# Patient Record
Sex: Female | Born: 1960
Health system: Southern US, Community
[De-identification: ages and names within clinical notes are randomized; demographics above are authoritative.]

## PROBLEM LIST (undated history)

## (undated) DIAGNOSIS — D649 Anemia, unspecified: Secondary | ICD-10-CM

## (undated) DIAGNOSIS — K219 Gastro-esophageal reflux disease without esophagitis: Secondary | ICD-10-CM

## (undated) DIAGNOSIS — E079 Disorder of thyroid, unspecified: Secondary | ICD-10-CM

## (undated) DIAGNOSIS — Z9289 Personal history of other medical treatment: Secondary | ICD-10-CM

## (undated) DIAGNOSIS — D219 Benign neoplasm of connective and other soft tissue, unspecified: Secondary | ICD-10-CM

## (undated) DIAGNOSIS — T7840XA Allergy, unspecified, initial encounter: Secondary | ICD-10-CM

## (undated) DIAGNOSIS — J302 Other seasonal allergic rhinitis: Secondary | ICD-10-CM

## (undated) HISTORY — DX: Gastro-esophageal reflux disease without esophagitis: K21.9

## (undated) HISTORY — DX: Allergy, unspecified, initial encounter: T78.40XA

## (undated) HISTORY — PX: COLONOSCOPY: SHX174

## (undated) HISTORY — PX: UPPER GI ENDOSCOPY: SHX6162

## (undated) HISTORY — PX: WISDOM TOOTH EXTRACTION: SHX21

## (undated) HISTORY — PX: DILATION AND CURETTAGE OF UTERUS: SHX78

## (undated) HISTORY — DX: Disorder of thyroid, unspecified: E07.9

---

## 2004-02-27 HISTORY — PX: BREAST SURGERY: SHX581

## 2005-05-23 ENCOUNTER — Other Ambulatory Visit: Admission: RE | Admit: 2005-05-23 | Discharge: 2005-05-23 | Payer: Self-pay | Admitting: Obstetrics and Gynecology

## 2012-08-18 ENCOUNTER — Other Ambulatory Visit: Payer: Self-pay | Admitting: Obstetrics and Gynecology

## 2012-08-18 DIAGNOSIS — R928 Other abnormal and inconclusive findings on diagnostic imaging of breast: Secondary | ICD-10-CM

## 2012-09-08 ENCOUNTER — Ambulatory Visit
Admission: RE | Admit: 2012-09-08 | Discharge: 2012-09-08 | Disposition: A | Discharge status: Home / Self Care | Payer: BC Managed Care – PPO | Source: Ambulatory Visit | Attending: Obstetrics and Gynecology | Admitting: Obstetrics and Gynecology

## 2012-09-08 DIAGNOSIS — R928 Other abnormal and inconclusive findings on diagnostic imaging of breast: Secondary | ICD-10-CM

## 2013-12-04 LAB — HM COLONOSCOPY

## 2014-07-14 IMAGING — MG MM DIGITAL DIAGNOSTIC UNILAT*R*
2 series · 2 of 2 positions shown · non-contrast
Comparison: 08/11/2012

CLINICAL DATA: Possible mass right breast identified on recent
screening mammogram.  The patient has a history of bilateral breast
reduction.

DIGITAL DIAGNOSTIC RIGHT MAMMOGRAM WITHOUT CAD AND RIGHT BREAST
ULTRASOUND:

[R CC]
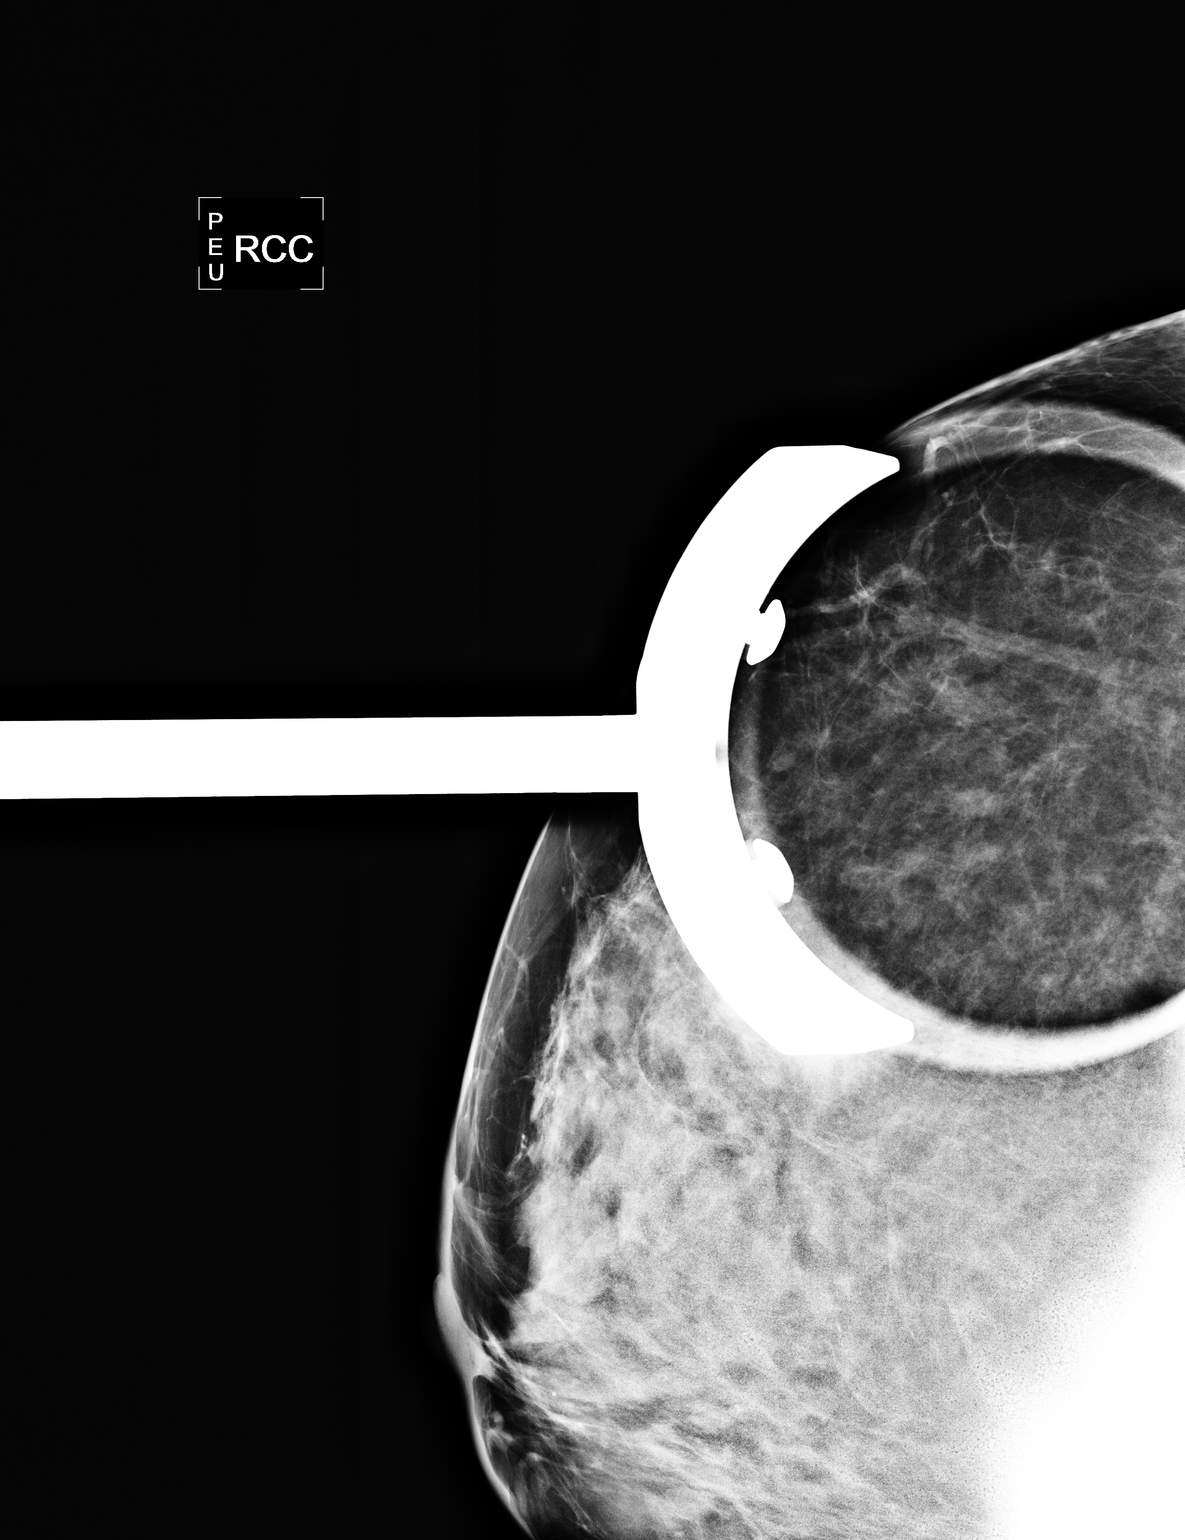

[R MLO]
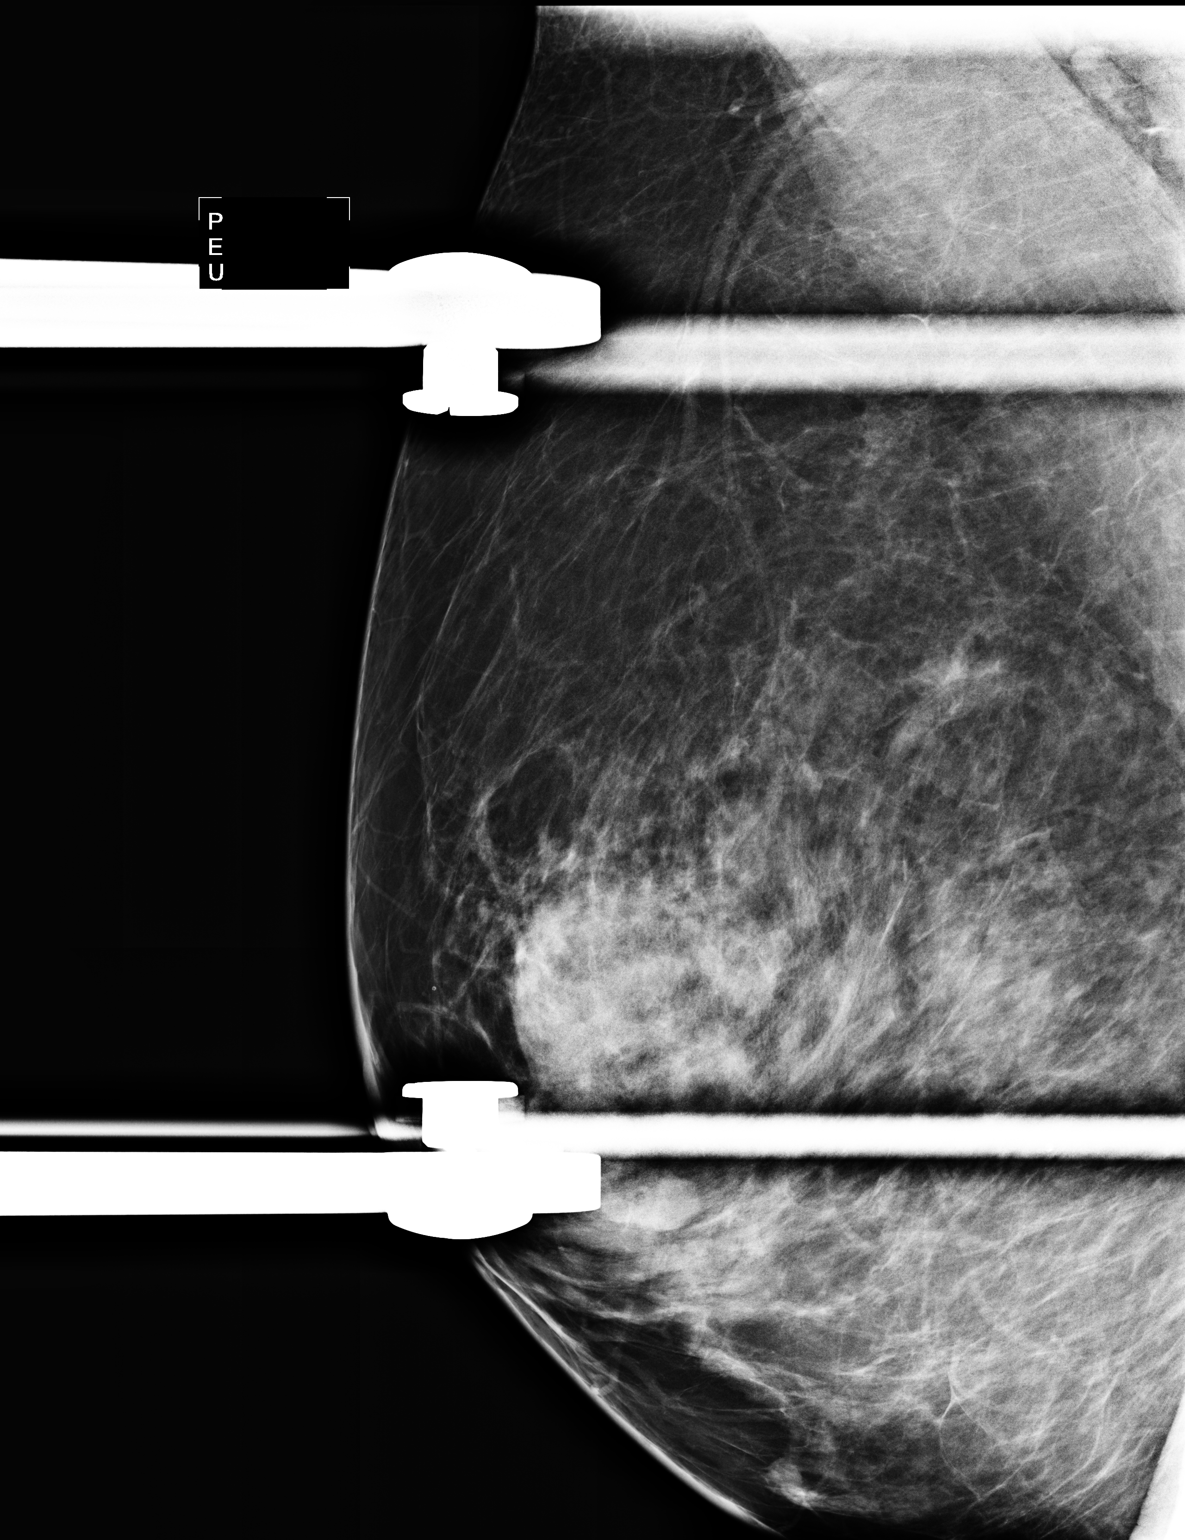

[2 of 2 positions shown; findings below may reference images not displayed]

FINDINGS: ACR Breast Density Category c:  The breasts are heterogeneously
dense, which may obscure small masses.

Focal spot compression views of the upper outer right breast show
no definite persistent mass or distortion.  Breast reduction
changes are noted.

On physical exam, no mass is palpated in the upper outer right
breast.

Ultrasound is performed, showing normal fibroglandular breast
tissue.  No solid or cystic mass is seen in the upper outer
quadrant of the right breast.
IMPRESSION: No evidence of malignancy in the right breast.

RECOMMENDATION:
Bilateral screening mammogram in 1 year.

I have discussed the findings and recommendations with the patient.
Results were also provided in writing at the conclusion of the
visit.  If applicable, a reminder letter will be sent to the
patient regarding the next appointment.

BI-RADS CATEGORY 1:  Negative.

## 2015-10-11 DIAGNOSIS — Z85828 Personal history of other malignant neoplasm of skin: Secondary | ICD-10-CM | POA: Diagnosis not present

## 2015-10-11 DIAGNOSIS — D2272 Melanocytic nevi of left lower limb, including hip: Secondary | ICD-10-CM | POA: Diagnosis not present

## 2015-10-11 DIAGNOSIS — L821 Other seborrheic keratosis: Secondary | ICD-10-CM | POA: Diagnosis not present

## 2015-10-11 DIAGNOSIS — D225 Melanocytic nevi of trunk: Secondary | ICD-10-CM | POA: Diagnosis not present

## 2015-12-20 DIAGNOSIS — Z23 Encounter for immunization: Secondary | ICD-10-CM | POA: Diagnosis not present

## 2016-01-03 DIAGNOSIS — Z0000 Encounter for general adult medical examination without abnormal findings: Secondary | ICD-10-CM | POA: Diagnosis not present

## 2016-01-03 DIAGNOSIS — Z136 Encounter for screening for cardiovascular disorders: Secondary | ICD-10-CM | POA: Diagnosis not present

## 2016-01-05 DIAGNOSIS — Z23 Encounter for immunization: Secondary | ICD-10-CM | POA: Diagnosis not present

## 2016-01-05 DIAGNOSIS — Z6829 Body mass index (BMI) 29.0-29.9, adult: Secondary | ICD-10-CM | POA: Diagnosis not present

## 2016-01-05 DIAGNOSIS — Z0000 Encounter for general adult medical examination without abnormal findings: Secondary | ICD-10-CM | POA: Diagnosis not present

## 2016-03-09 DIAGNOSIS — T1502XA Foreign body in cornea, left eye, initial encounter: Secondary | ICD-10-CM | POA: Diagnosis not present

## 2016-03-13 DIAGNOSIS — T1502XD Foreign body in cornea, left eye, subsequent encounter: Secondary | ICD-10-CM | POA: Diagnosis not present

## 2016-03-13 DIAGNOSIS — H11042 Peripheral pterygium, stationary, left eye: Secondary | ICD-10-CM | POA: Diagnosis not present

## 2016-03-28 DIAGNOSIS — Z01419 Encounter for gynecological examination (general) (routine) without abnormal findings: Secondary | ICD-10-CM | POA: Diagnosis not present

## 2016-03-28 DIAGNOSIS — Z6830 Body mass index (BMI) 30.0-30.9, adult: Secondary | ICD-10-CM | POA: Diagnosis not present

## 2016-03-28 DIAGNOSIS — Z1231 Encounter for screening mammogram for malignant neoplasm of breast: Secondary | ICD-10-CM | POA: Diagnosis not present

## 2016-03-28 DIAGNOSIS — N390 Urinary tract infection, site not specified: Secondary | ICD-10-CM | POA: Diagnosis not present

## 2016-04-12 DIAGNOSIS — J069 Acute upper respiratory infection, unspecified: Secondary | ICD-10-CM | POA: Diagnosis not present

## 2016-08-13 DIAGNOSIS — R7989 Other specified abnormal findings of blood chemistry: Secondary | ICD-10-CM | POA: Diagnosis not present

## 2016-08-13 DIAGNOSIS — Z13220 Encounter for screening for lipoid disorders: Secondary | ICD-10-CM | POA: Diagnosis not present

## 2016-08-14 DIAGNOSIS — R635 Abnormal weight gain: Secondary | ICD-10-CM | POA: Diagnosis not present

## 2016-08-14 DIAGNOSIS — E782 Mixed hyperlipidemia: Secondary | ICD-10-CM | POA: Diagnosis not present

## 2016-08-14 DIAGNOSIS — Z6830 Body mass index (BMI) 30.0-30.9, adult: Secondary | ICD-10-CM | POA: Diagnosis not present

## 2016-08-14 DIAGNOSIS — Z23 Encounter for immunization: Secondary | ICD-10-CM | POA: Diagnosis not present

## 2016-08-14 DIAGNOSIS — R7989 Other specified abnormal findings of blood chemistry: Secondary | ICD-10-CM | POA: Diagnosis not present

## 2016-10-02 DIAGNOSIS — D225 Melanocytic nevi of trunk: Secondary | ICD-10-CM | POA: Diagnosis not present

## 2016-10-02 DIAGNOSIS — B070 Plantar wart: Secondary | ICD-10-CM | POA: Diagnosis not present

## 2016-10-02 DIAGNOSIS — Z85828 Personal history of other malignant neoplasm of skin: Secondary | ICD-10-CM | POA: Diagnosis not present

## 2016-10-02 DIAGNOSIS — D2272 Melanocytic nevi of left lower limb, including hip: Secondary | ICD-10-CM | POA: Diagnosis not present

## 2016-10-12 DIAGNOSIS — M9901 Segmental and somatic dysfunction of cervical region: Secondary | ICD-10-CM | POA: Diagnosis not present

## 2016-10-12 DIAGNOSIS — M791 Myalgia: Secondary | ICD-10-CM | POA: Diagnosis not present

## 2016-10-12 DIAGNOSIS — M9902 Segmental and somatic dysfunction of thoracic region: Secondary | ICD-10-CM | POA: Diagnosis not present

## 2016-10-12 DIAGNOSIS — M7542 Impingement syndrome of left shoulder: Secondary | ICD-10-CM | POA: Diagnosis not present

## 2016-10-17 DIAGNOSIS — M7542 Impingement syndrome of left shoulder: Secondary | ICD-10-CM | POA: Diagnosis not present

## 2016-10-17 DIAGNOSIS — M9901 Segmental and somatic dysfunction of cervical region: Secondary | ICD-10-CM | POA: Diagnosis not present

## 2016-10-17 DIAGNOSIS — M791 Myalgia: Secondary | ICD-10-CM | POA: Diagnosis not present

## 2016-10-17 DIAGNOSIS — M9902 Segmental and somatic dysfunction of thoracic region: Secondary | ICD-10-CM | POA: Diagnosis not present

## 2016-11-06 DIAGNOSIS — M9901 Segmental and somatic dysfunction of cervical region: Secondary | ICD-10-CM | POA: Diagnosis not present

## 2016-11-06 DIAGNOSIS — M7542 Impingement syndrome of left shoulder: Secondary | ICD-10-CM | POA: Diagnosis not present

## 2016-11-06 DIAGNOSIS — M791 Myalgia: Secondary | ICD-10-CM | POA: Diagnosis not present

## 2016-11-06 DIAGNOSIS — M9902 Segmental and somatic dysfunction of thoracic region: Secondary | ICD-10-CM | POA: Diagnosis not present

## 2016-11-15 DIAGNOSIS — M9902 Segmental and somatic dysfunction of thoracic region: Secondary | ICD-10-CM | POA: Diagnosis not present

## 2016-11-15 DIAGNOSIS — M7712 Lateral epicondylitis, left elbow: Secondary | ICD-10-CM | POA: Diagnosis not present

## 2016-11-15 DIAGNOSIS — M9901 Segmental and somatic dysfunction of cervical region: Secondary | ICD-10-CM | POA: Diagnosis not present

## 2016-11-15 DIAGNOSIS — M7542 Impingement syndrome of left shoulder: Secondary | ICD-10-CM | POA: Diagnosis not present

## 2016-11-22 DIAGNOSIS — M9902 Segmental and somatic dysfunction of thoracic region: Secondary | ICD-10-CM | POA: Diagnosis not present

## 2016-11-22 DIAGNOSIS — M9901 Segmental and somatic dysfunction of cervical region: Secondary | ICD-10-CM | POA: Diagnosis not present

## 2016-11-22 DIAGNOSIS — M7542 Impingement syndrome of left shoulder: Secondary | ICD-10-CM | POA: Diagnosis not present

## 2016-11-22 DIAGNOSIS — M7712 Lateral epicondylitis, left elbow: Secondary | ICD-10-CM | POA: Diagnosis not present

## 2016-12-04 DIAGNOSIS — M7712 Lateral epicondylitis, left elbow: Secondary | ICD-10-CM | POA: Diagnosis not present

## 2016-12-04 DIAGNOSIS — M9901 Segmental and somatic dysfunction of cervical region: Secondary | ICD-10-CM | POA: Diagnosis not present

## 2016-12-04 DIAGNOSIS — M9902 Segmental and somatic dysfunction of thoracic region: Secondary | ICD-10-CM | POA: Diagnosis not present

## 2016-12-04 DIAGNOSIS — M7542 Impingement syndrome of left shoulder: Secondary | ICD-10-CM | POA: Diagnosis not present

## 2017-01-09 DIAGNOSIS — M9902 Segmental and somatic dysfunction of thoracic region: Secondary | ICD-10-CM | POA: Diagnosis not present

## 2017-01-09 DIAGNOSIS — M7542 Impingement syndrome of left shoulder: Secondary | ICD-10-CM | POA: Diagnosis not present

## 2017-01-09 DIAGNOSIS — S76911A Strain of unspecified muscles, fascia and tendons at thigh level, right thigh, initial encounter: Secondary | ICD-10-CM | POA: Diagnosis not present

## 2017-01-09 DIAGNOSIS — M9901 Segmental and somatic dysfunction of cervical region: Secondary | ICD-10-CM | POA: Diagnosis not present

## 2017-01-11 DIAGNOSIS — M7542 Impingement syndrome of left shoulder: Secondary | ICD-10-CM | POA: Diagnosis not present

## 2017-01-11 DIAGNOSIS — Z0000 Encounter for general adult medical examination without abnormal findings: Secondary | ICD-10-CM | POA: Diagnosis not present

## 2017-01-11 DIAGNOSIS — Z13220 Encounter for screening for lipoid disorders: Secondary | ICD-10-CM | POA: Diagnosis not present

## 2017-01-11 DIAGNOSIS — Z114 Encounter for screening for human immunodeficiency virus [HIV]: Secondary | ICD-10-CM | POA: Diagnosis not present

## 2017-01-11 DIAGNOSIS — Z1329 Encounter for screening for other suspected endocrine disorder: Secondary | ICD-10-CM | POA: Diagnosis not present

## 2017-01-11 DIAGNOSIS — M9901 Segmental and somatic dysfunction of cervical region: Secondary | ICD-10-CM | POA: Diagnosis not present

## 2017-01-11 DIAGNOSIS — M9902 Segmental and somatic dysfunction of thoracic region: Secondary | ICD-10-CM | POA: Diagnosis not present

## 2017-01-11 DIAGNOSIS — S76911A Strain of unspecified muscles, fascia and tendons at thigh level, right thigh, initial encounter: Secondary | ICD-10-CM | POA: Diagnosis not present

## 2017-01-14 DIAGNOSIS — M7918 Myalgia, other site: Secondary | ICD-10-CM | POA: Diagnosis not present

## 2017-01-14 DIAGNOSIS — S76911A Strain of unspecified muscles, fascia and tendons at thigh level, right thigh, initial encounter: Secondary | ICD-10-CM | POA: Diagnosis not present

## 2017-01-14 DIAGNOSIS — M79604 Pain in right leg: Secondary | ICD-10-CM | POA: Diagnosis not present

## 2017-01-16 DIAGNOSIS — Z0000 Encounter for general adult medical examination without abnormal findings: Secondary | ICD-10-CM | POA: Diagnosis not present

## 2017-01-16 DIAGNOSIS — Z23 Encounter for immunization: Secondary | ICD-10-CM | POA: Diagnosis not present

## 2017-01-16 DIAGNOSIS — M79604 Pain in right leg: Secondary | ICD-10-CM | POA: Diagnosis not present

## 2017-01-16 DIAGNOSIS — S76911A Strain of unspecified muscles, fascia and tendons at thigh level, right thigh, initial encounter: Secondary | ICD-10-CM | POA: Diagnosis not present

## 2017-01-16 DIAGNOSIS — Z6829 Body mass index (BMI) 29.0-29.9, adult: Secondary | ICD-10-CM | POA: Diagnosis not present

## 2017-01-16 DIAGNOSIS — M7918 Myalgia, other site: Secondary | ICD-10-CM | POA: Diagnosis not present

## 2017-01-25 DIAGNOSIS — M79604 Pain in right leg: Secondary | ICD-10-CM | POA: Diagnosis not present

## 2017-01-25 DIAGNOSIS — M7918 Myalgia, other site: Secondary | ICD-10-CM | POA: Diagnosis not present

## 2017-01-25 DIAGNOSIS — S76911A Strain of unspecified muscles, fascia and tendons at thigh level, right thigh, initial encounter: Secondary | ICD-10-CM | POA: Diagnosis not present

## 2017-02-01 DIAGNOSIS — M7918 Myalgia, other site: Secondary | ICD-10-CM | POA: Diagnosis not present

## 2017-02-01 DIAGNOSIS — S76911A Strain of unspecified muscles, fascia and tendons at thigh level, right thigh, initial encounter: Secondary | ICD-10-CM | POA: Diagnosis not present

## 2017-02-01 DIAGNOSIS — M79604 Pain in right leg: Secondary | ICD-10-CM | POA: Diagnosis not present

## 2017-02-06 DIAGNOSIS — M79604 Pain in right leg: Secondary | ICD-10-CM | POA: Diagnosis not present

## 2017-02-06 DIAGNOSIS — M7918 Myalgia, other site: Secondary | ICD-10-CM | POA: Diagnosis not present

## 2017-02-06 DIAGNOSIS — S76911A Strain of unspecified muscles, fascia and tendons at thigh level, right thigh, initial encounter: Secondary | ICD-10-CM | POA: Diagnosis not present

## 2017-05-30 DIAGNOSIS — D259 Leiomyoma of uterus, unspecified: Secondary | ICD-10-CM | POA: Diagnosis not present

## 2017-06-25 DIAGNOSIS — Z6830 Body mass index (BMI) 30.0-30.9, adult: Secondary | ICD-10-CM | POA: Diagnosis not present

## 2017-06-25 DIAGNOSIS — Z1231 Encounter for screening mammogram for malignant neoplasm of breast: Secondary | ICD-10-CM | POA: Diagnosis not present

## 2017-06-25 DIAGNOSIS — Z01419 Encounter for gynecological examination (general) (routine) without abnormal findings: Secondary | ICD-10-CM | POA: Diagnosis not present

## 2017-06-28 DIAGNOSIS — Z803 Family history of malignant neoplasm of breast: Secondary | ICD-10-CM | POA: Diagnosis not present

## 2017-06-28 DIAGNOSIS — Z808 Family history of malignant neoplasm of other organs or systems: Secondary | ICD-10-CM | POA: Diagnosis not present

## 2017-06-28 DIAGNOSIS — Z13820 Encounter for screening for osteoporosis: Secondary | ICD-10-CM | POA: Diagnosis not present

## 2017-06-28 DIAGNOSIS — Z800 Family history of malignant neoplasm of digestive organs: Secondary | ICD-10-CM | POA: Diagnosis not present

## 2017-06-28 DIAGNOSIS — Z8042 Family history of malignant neoplasm of prostate: Secondary | ICD-10-CM | POA: Diagnosis not present

## 2017-08-07 DIAGNOSIS — Z809 Family history of malignant neoplasm, unspecified: Secondary | ICD-10-CM | POA: Diagnosis not present

## 2017-08-08 DIAGNOSIS — L814 Other melanin hyperpigmentation: Secondary | ICD-10-CM | POA: Diagnosis not present

## 2017-08-08 DIAGNOSIS — L821 Other seborrheic keratosis: Secondary | ICD-10-CM | POA: Diagnosis not present

## 2017-08-08 DIAGNOSIS — D1801 Hemangioma of skin and subcutaneous tissue: Secondary | ICD-10-CM | POA: Diagnosis not present

## 2017-08-08 DIAGNOSIS — Z85828 Personal history of other malignant neoplasm of skin: Secondary | ICD-10-CM | POA: Diagnosis not present

## 2017-12-03 DIAGNOSIS — D2261 Melanocytic nevi of right upper limb, including shoulder: Secondary | ICD-10-CM | POA: Diagnosis not present

## 2017-12-03 DIAGNOSIS — Z85828 Personal history of other malignant neoplasm of skin: Secondary | ICD-10-CM | POA: Diagnosis not present

## 2017-12-03 DIAGNOSIS — L814 Other melanin hyperpigmentation: Secondary | ICD-10-CM | POA: Diagnosis not present

## 2017-12-03 DIAGNOSIS — D1801 Hemangioma of skin and subcutaneous tissue: Secondary | ICD-10-CM | POA: Diagnosis not present

## 2017-12-03 DIAGNOSIS — B078 Other viral warts: Secondary | ICD-10-CM | POA: Diagnosis not present

## 2017-12-30 DIAGNOSIS — D259 Leiomyoma of uterus, unspecified: Secondary | ICD-10-CM | POA: Diagnosis not present

## 2017-12-30 DIAGNOSIS — R102 Pelvic and perineal pain: Secondary | ICD-10-CM | POA: Diagnosis not present

## 2018-01-29 DIAGNOSIS — Z114 Encounter for screening for human immunodeficiency virus [HIV]: Secondary | ICD-10-CM | POA: Diagnosis not present

## 2018-01-29 DIAGNOSIS — Z13220 Encounter for screening for lipoid disorders: Secondary | ICD-10-CM | POA: Diagnosis not present

## 2018-01-29 DIAGNOSIS — Z1329 Encounter for screening for other suspected endocrine disorder: Secondary | ICD-10-CM | POA: Diagnosis not present

## 2018-01-29 DIAGNOSIS — Z0000 Encounter for general adult medical examination without abnormal findings: Secondary | ICD-10-CM | POA: Diagnosis not present

## 2018-01-29 LAB — BASIC METABOLIC PANEL
BUN: 24 — AB (ref 4–21)
Creatinine: 0.9 (ref 0.5–1.1)
Glucose: 112

## 2018-01-29 LAB — COMPREHENSIVE METABOLIC PANEL: GFR calc non Af Amer: 70

## 2018-01-29 LAB — HM HIV SCREENING LAB: HM HIV Screening: NEGATIVE

## 2018-01-29 LAB — TSH: TSH: 0.57 (ref 0.41–5.90)

## 2018-02-03 DIAGNOSIS — Z23 Encounter for immunization: Secondary | ICD-10-CM | POA: Diagnosis not present

## 2018-02-03 DIAGNOSIS — Z6830 Body mass index (BMI) 30.0-30.9, adult: Secondary | ICD-10-CM | POA: Diagnosis not present

## 2018-02-03 DIAGNOSIS — Z0000 Encounter for general adult medical examination without abnormal findings: Secondary | ICD-10-CM | POA: Diagnosis not present

## 2018-02-12 ENCOUNTER — Encounter (HOSPITAL_COMMUNITY): Payer: Self-pay | Admitting: *Deleted

## 2018-02-12 ENCOUNTER — Other Ambulatory Visit: Payer: Self-pay

## 2018-02-12 ENCOUNTER — Inpatient Hospital Stay (HOSPITAL_COMMUNITY): Admission: RE | Admit: 2018-02-12 | Payer: BC Managed Care – PPO | Source: Ambulatory Visit

## 2018-02-12 ENCOUNTER — Encounter (HOSPITAL_COMMUNITY)
Admission: RE | Admit: 2018-02-12 | Discharge: 2018-02-12 | Disposition: A | Discharge status: Home / Self Care | Payer: BLUE CROSS/BLUE SHIELD | Source: Ambulatory Visit | Attending: Obstetrics and Gynecology | Admitting: Obstetrics and Gynecology

## 2018-02-12 DIAGNOSIS — Z01812 Encounter for preprocedural laboratory examination: Secondary | ICD-10-CM | POA: Diagnosis not present

## 2018-02-12 HISTORY — DX: Other seasonal allergic rhinitis: J30.2

## 2018-02-12 HISTORY — DX: Anemia, unspecified: D64.9

## 2018-02-12 HISTORY — DX: Personal history of other medical treatment: Z92.89

## 2018-02-12 HISTORY — DX: Benign neoplasm of connective and other soft tissue, unspecified: D21.9

## 2018-02-12 LAB — CBC
HCT: 41.9 % (ref 36.0–46.0)
Hemoglobin: 14.3 g/dL (ref 12.0–15.0)
MCH: 29.9 pg (ref 26.0–34.0)
MCHC: 34.1 g/dL (ref 30.0–36.0)
MCV: 87.5 fL (ref 80.0–100.0)
Platelets: 255 10*3/uL (ref 150–400)
RBC: 4.79 MIL/uL (ref 3.87–5.11)
RDW: 12.6 % (ref 11.5–15.5)
WBC: 6 10*3/uL (ref 4.0–10.5)
nRBC: 0 % (ref 0.0–0.2)

## 2018-02-12 NOTE — Patient Instructions (Addendum)
Your procedure is scheduled on: Thursday, 02/27/18  Enter through the Main Entrance of [facility] at: 6 am  Pick up the phone at the desk and dial [phone].  Call this number if you have problems the morning of surgery: [phone].  Remember: Do NOT eat food or Do NOT drink clear liquids (including water) after Wednesday 02/26/18  Take these medicines the morning of surgery with a SIP OF WATER: Claritin if needed  Brush your teeth on the day of surgery.  Stop herbal medications, vitamin supplements, Ibuprofen/NSAIDS 1 week prior to surgery- Thursday, 02/20/18.  Do NOT wear jewelry (body piercing), metal hair clips/bobby pins, make-up, or nail polish. Do NOT wear lotions, powders, or perfumes.  You may wear deoderant. Do NOT shave for 48 hours prior to surgery. Do NOT bring valuables to the hospital. Contacts may not be worn into surgery.  Leave suitcase in car.  After surgery it may be brought to your room.  For patients admitted to the hospital, checkout time is 11:00 AM the day of discharge. Have a responsible adult drive you home and stay with you for 24 hours after your procedure.  Home with husband Elizabeth Carpenter cell [phone]

## 2018-02-25 ENCOUNTER — Inpatient Hospital Stay (HOSPITAL_COMMUNITY): Admission: RE | Admit: 2018-02-25 | Payer: Self-pay | Source: Ambulatory Visit

## 2018-02-25 DIAGNOSIS — D259 Leiomyoma of uterus, unspecified: Secondary | ICD-10-CM | POA: Diagnosis not present

## 2018-02-25 DIAGNOSIS — R102 Pelvic and perineal pain: Secondary | ICD-10-CM | POA: Diagnosis not present

## 2018-02-26 NOTE — H&P (Addendum)
Elizabeth Carpenter is an 58 y.o. female presents for hysterectomy.   She has been having problems with pelvic fullness, pressure and pain from fibroids which are 12 to 14 weeks size.  Reviewed the previous pelvic ultrasound done on 05/30/2017 with the largest fibroids measuring 4.6 cm in size.  She has at least 4 to 5 moderate-sized fibroids.  She is continuing to have pelvic pressure, pain and increased fullness and would like to proceed with hysterectomy.  Pertinent Gynecological History: U/S 05/30/17 does show multiple fibroids, the largest measuring 4.6 x 3.9 cm, but at least 4 or 5 moderate-sized fibroids with overall uterus approximately 14 weeks size.   No LMP recorded (lmp unknown). Patient is postmenopausal.    Past Medical History:  Diagnosis Date  . Anemia   . Fibroids   . History of blood transfusion    Patient had a transfusion at birth [phone]  . Seasonal allergies   . SVD (spontaneous vaginal delivery)    x 2    Past Surgical History:  Procedure Laterality Date  . BREAST SURGERY  2006   reduction  . COLONOSCOPY    . DILATION AND CURETTAGE OF UTERUS    . UPPER GI ENDOSCOPY    . WISDOM TOOTH EXTRACTION      No family history on file.  Social History:  reports that she has never smoked. She has never used smokeless tobacco. She reports current alcohol use of about 3.0 - 5.0 standard drinks of alcohol per week. She reports that she does not use drugs.  Allergies:  Allergies  Allergen Reactions  . Morphine And Related Nausea And Vomiting  . Amoxicillin Rash    No medications prior to admission.    ROS  There were no vitals taken for this visit. Physical Exam  General:  Alert and oriented.  Normocephalic and atraumatic.  No thyromegaly or masses palpated.  Heart is regular rate and rhythm without murmur.  Lungs are clear to auscultation bilaterally.  Breasts without masses, lymphadenopathy or discharge.  Abdomen is soft, nontender, nondistended.  No rebound or  guarding.  No flank pain is noted.  Extremities without clubbing, cyanosis or edema.  Normal external genitalia, Bartholin, Skene's and urethra.  Vagina without lesions.  Cervix within normal limits.  Uterus is 12 weeks size.  No adnexal masses are palpable.  No inguinal lymphadenopathy palpable.  No results found for this or any previous visit (from the past 24 hour(s)).  No results found.  Assessment/Plan: Pelvic fullness, fibroids 14 weeks size.  Plan laparoscopically-assisted vaginal hysterectomy with bilateral salpingo-oophorectomy.  Discussed the possibility of converting to a TAH/BSO.  She is postmenopausal and does want to proceed with this plan of care. I discussed the plan of care, the pros and cons, risks and benefits and we discussed the recovery at length.  All her questions were answered and she gives informed consent.  Luz Lex 02/26/2018, 12:38 PM   This patient has been seen and examined.   All of her questions were answered.  Labs and vital signs reviewed.  Informed consent has been obtained.  The History and Physical is current. DL

## 2018-02-27 ENCOUNTER — Other Ambulatory Visit: Payer: Self-pay

## 2018-02-27 ENCOUNTER — Encounter (HOSPITAL_COMMUNITY)
Admission: RE | Disposition: A | Discharge status: Home / Self Care | Payer: Self-pay | Source: Ambulatory Visit | Attending: Obstetrics and Gynecology

## 2018-02-27 ENCOUNTER — Observation Stay (HOSPITAL_COMMUNITY)
Admission: RE | Admit: 2018-02-27 | Discharge: 2018-02-28 | Disposition: A | Discharge status: Home / Self Care | Payer: BLUE CROSS/BLUE SHIELD | Source: Ambulatory Visit | Attending: Obstetrics and Gynecology | Admitting: Obstetrics and Gynecology

## 2018-02-27 ENCOUNTER — Observation Stay (HOSPITAL_COMMUNITY): Payer: BLUE CROSS/BLUE SHIELD | Admitting: Anesthesiology

## 2018-02-27 ENCOUNTER — Encounter (HOSPITAL_COMMUNITY): Payer: Self-pay | Admitting: *Deleted

## 2018-02-27 DIAGNOSIS — D270 Benign neoplasm of right ovary: Secondary | ICD-10-CM | POA: Insufficient documentation

## 2018-02-27 DIAGNOSIS — D259 Leiomyoma of uterus, unspecified: Secondary | ICD-10-CM | POA: Diagnosis not present

## 2018-02-27 DIAGNOSIS — N802 Endometriosis of fallopian tube: Secondary | ICD-10-CM | POA: Diagnosis not present

## 2018-02-27 DIAGNOSIS — D252 Subserosal leiomyoma of uterus: Secondary | ICD-10-CM | POA: Insufficient documentation

## 2018-02-27 DIAGNOSIS — N803 Endometriosis of pelvic peritoneum: Secondary | ICD-10-CM | POA: Diagnosis not present

## 2018-02-27 DIAGNOSIS — D250 Submucous leiomyoma of uterus: Secondary | ICD-10-CM | POA: Insufficient documentation

## 2018-02-27 DIAGNOSIS — Z885 Allergy status to narcotic agent status: Secondary | ICD-10-CM | POA: Diagnosis not present

## 2018-02-27 DIAGNOSIS — E669 Obesity, unspecified: Secondary | ICD-10-CM | POA: Diagnosis not present

## 2018-02-27 DIAGNOSIS — N736 Female pelvic peritoneal adhesions (postinfective): Secondary | ICD-10-CM | POA: Insufficient documentation

## 2018-02-27 DIAGNOSIS — D251 Intramural leiomyoma of uterus: Principal | ICD-10-CM | POA: Insufficient documentation

## 2018-02-27 DIAGNOSIS — D649 Anemia, unspecified: Secondary | ICD-10-CM | POA: Insufficient documentation

## 2018-02-27 DIAGNOSIS — Z6830 Body mass index (BMI) 30.0-30.9, adult: Secondary | ICD-10-CM | POA: Diagnosis not present

## 2018-02-27 DIAGNOSIS — R102 Pelvic and perineal pain: Secondary | ICD-10-CM | POA: Insufficient documentation

## 2018-02-27 DIAGNOSIS — N9489 Other specified conditions associated with female genital organs and menstrual cycle: Secondary | ICD-10-CM | POA: Insufficient documentation

## 2018-02-27 DIAGNOSIS — Z880 Allergy status to penicillin: Secondary | ICD-10-CM | POA: Insufficient documentation

## 2018-02-27 DIAGNOSIS — Z791 Long term (current) use of non-steroidal anti-inflammatories (NSAID): Secondary | ICD-10-CM | POA: Insufficient documentation

## 2018-02-27 DIAGNOSIS — N801 Endometriosis of ovary: Secondary | ICD-10-CM | POA: Diagnosis not present

## 2018-02-27 DIAGNOSIS — Z90710 Acquired absence of both cervix and uterus: Secondary | ICD-10-CM | POA: Diagnosis present

## 2018-02-27 HISTORY — PX: LAPAROSCOPIC VAGINAL HYSTERECTOMY WITH SALPINGO OOPHORECTOMY: SHX6681

## 2018-02-27 HISTORY — PX: HYSTERECTOMY ABDOMINAL WITH SALPINGO-OOPHORECTOMY: SHX6792

## 2018-02-27 LAB — TYPE AND SCREEN
ABO/RH(D): B POS
Antibody Screen: NEGATIVE

## 2018-02-27 LAB — ABO/RH: ABO/RH(D): B POS

## 2018-02-27 SURGERY — HYSTERECTOMY, VAGINAL, LAPAROSCOPY-ASSISTED, WITH SALPINGO-OOPHORECTOMY
Anesthesia: General | Site: Abdomen | Laterality: Bilateral

## 2018-02-27 MED ORDER — DEXAMETHASONE SODIUM PHOSPHATE 4 MG/ML IJ SOLN
INTRAMUSCULAR | Status: AC
  Filled 2018-02-27: qty 1

## 2018-02-27 MED ORDER — SCOPOLAMINE 1 MG/3DAYS TD PT72
MEDICATED_PATCH | TRANSDERMAL | Status: AC
  Administered 2018-02-27: 1.5 mg via TRANSDERMAL
  Filled 2018-02-27: qty 1

## 2018-02-27 MED ORDER — OXYCODONE HCL 5 MG/5ML PO SOLN: 5.0000 mg | Freq: Once | ORAL | Status: DC | PRN

## 2018-02-27 MED ORDER — ROCURONIUM BROMIDE 100 MG/10ML IV SOLN
INTRAVENOUS | Status: DC | PRN
  Administered 2018-02-27: 50 mg via INTRAVENOUS

## 2018-02-27 MED ORDER — ACETAMINOPHEN 325 MG PO TABS
650.0000 mg | ORAL_TABLET | Freq: Four times a day (QID) | ORAL | Status: DC | PRN
  Administered 2018-02-28 (×2): 650 mg via ORAL
  Filled 2018-02-27 (×2): qty 2

## 2018-02-27 MED ORDER — FENTANYL CITRATE (PF) 100 MCG/2ML IJ SOLN
INTRAMUSCULAR | Status: AC
  Filled 2018-02-27: qty 2

## 2018-02-27 MED ORDER — DIPHENHYDRAMINE HCL 50 MG/ML IJ SOLN: 12.5000 mg | Freq: Four times a day (QID) | INTRAMUSCULAR | Status: DC | PRN

## 2018-02-27 MED ORDER — DEXTROSE-NACL 5-0.45 % IV SOLN
INTRAVENOUS | Status: DC
  Administered 2018-02-27 (×2): via INTRAVENOUS

## 2018-02-27 MED ORDER — IBUPROFEN 600 MG PO TABS
600.0000 mg | ORAL_TABLET | Freq: Four times a day (QID) | ORAL | Status: DC | PRN
  Administered 2018-02-27 – 2018-02-28 (×2): 600 mg via ORAL
  Filled 2018-02-27 (×2): qty 1

## 2018-02-27 MED ORDER — MENTHOL 3 MG MT LOZG: 1.0000 | LOZENGE | OROMUCOSAL | Status: DC | PRN

## 2018-02-27 MED ORDER — PROMETHAZINE HCL 25 MG/ML IJ SOLN: 6.2500 mg | INTRAMUSCULAR | Status: DC | PRN

## 2018-02-27 MED ORDER — SODIUM CHLORIDE 0.9 % IR SOLN
Status: DC | PRN
  Administered 2018-02-27: 3000 mL

## 2018-02-27 MED ORDER — OXYCODONE HCL 5 MG PO TABS: 5.0000 mg | ORAL_TABLET | Freq: Four times a day (QID) | ORAL | Status: DC | PRN

## 2018-02-27 MED ORDER — SUGAMMADEX SODIUM 200 MG/2ML IV SOLN
INTRAVENOUS | Status: DC | PRN
  Administered 2018-02-27: 200 mg via INTRAVENOUS

## 2018-02-27 MED ORDER — LACTATED RINGERS IV SOLN
INTRAVENOUS | Status: DC
  Administered 2018-02-27 (×2): via INTRAVENOUS

## 2018-02-27 MED ORDER — BUPIVACAINE HCL (PF) 0.25 % IJ SOLN
INTRAMUSCULAR | Status: DC | PRN
  Administered 2018-02-27: 10 mL

## 2018-02-27 MED ORDER — HYDROMORPHONE HCL 1 MG/ML IJ SOLN
INTRAMUSCULAR | Status: AC
  Filled 2018-02-27: qty 1

## 2018-02-27 MED ORDER — OXYCODONE HCL 5 MG PO TABS: 5.0000 mg | ORAL_TABLET | Freq: Once | ORAL | Status: DC | PRN

## 2018-02-27 MED ORDER — SCOPOLAMINE 1 MG/3DAYS TD PT72
1.0000 | MEDICATED_PATCH | Freq: Once | TRANSDERMAL | Status: DC
  Administered 2018-02-27: 1.5 mg via TRANSDERMAL

## 2018-02-27 MED ORDER — DEXAMETHASONE SODIUM PHOSPHATE 10 MG/ML IJ SOLN
INTRAMUSCULAR | Status: DC | PRN
  Administered 2018-02-27: 4 mg via INTRAVENOUS

## 2018-02-27 MED ORDER — ROCURONIUM BROMIDE 100 MG/10ML IV SOLN
INTRAVENOUS | Status: AC
  Filled 2018-02-27: qty 1

## 2018-02-27 MED ORDER — ALUM & MAG HYDROXIDE-SIMETH 200-200-20 MG/5ML PO SUSP: 30.0000 mL | ORAL | Status: DC | PRN

## 2018-02-27 MED ORDER — ONDANSETRON HCL 4 MG/2ML IJ SOLN: 4.0000 mg | Freq: Four times a day (QID) | INTRAMUSCULAR | Status: DC | PRN

## 2018-02-27 MED ORDER — ZOLPIDEM TARTRATE 5 MG PO TABS: 5.0000 mg | ORAL_TABLET | Freq: Every evening | ORAL | Status: DC | PRN

## 2018-02-27 MED ORDER — DIPHENHYDRAMINE HCL 12.5 MG/5ML PO ELIX: 12.5000 mg | ORAL_SOLUTION | Freq: Four times a day (QID) | ORAL | Status: DC | PRN

## 2018-02-27 MED ORDER — FENTANYL CITRATE (PF) 250 MCG/5ML IJ SOLN
INTRAMUSCULAR | Status: AC
  Filled 2018-02-27: qty 5

## 2018-02-27 MED ORDER — ONDANSETRON HCL 4 MG/2ML IJ SOLN
INTRAMUSCULAR | Status: AC
  Filled 2018-02-27: qty 2

## 2018-02-27 MED ORDER — SODIUM CHLORIDE 0.9% FLUSH: 9.0000 mL | INTRAVENOUS | Status: DC | PRN

## 2018-02-27 MED ORDER — BUPIVACAINE HCL (PF) 0.25 % IJ SOLN
INTRAMUSCULAR | Status: AC
  Filled 2018-02-27: qty 30

## 2018-02-27 MED ORDER — MIDAZOLAM HCL 2 MG/2ML IJ SOLN
INTRAMUSCULAR | Status: AC
  Filled 2018-02-27: qty 2

## 2018-02-27 MED ORDER — HYDROMORPHONE HCL 1 MG/ML IJ SOLN
INTRAMUSCULAR | Status: DC | PRN
  Administered 2018-02-27 (×2): 0.5 mg via INTRAVENOUS

## 2018-02-27 MED ORDER — LIDOCAINE HCL (CARDIAC) PF 100 MG/5ML IV SOSY
PREFILLED_SYRINGE | INTRAVENOUS | Status: DC | PRN
  Administered 2018-02-27: 80 mg via INTRAVENOUS

## 2018-02-27 MED ORDER — MIDAZOLAM HCL 2 MG/2ML IJ SOLN
INTRAMUSCULAR | Status: DC | PRN
  Administered 2018-02-27: 2 mg via INTRAVENOUS

## 2018-02-27 MED ORDER — LIDOCAINE HCL (CARDIAC) PF 100 MG/5ML IV SOSY
PREFILLED_SYRINGE | INTRAVENOUS | Status: AC
  Filled 2018-02-27: qty 5

## 2018-02-27 MED ORDER — GENTAMICIN SULFATE 40 MG/ML IJ SOLN
INTRAVENOUS | Status: AC
  Administered 2018-02-27: 340 mg via INTRAVENOUS
  Filled 2018-02-27: qty 8.5

## 2018-02-27 MED ORDER — PROPOFOL 10 MG/ML IV BOLUS
INTRAVENOUS | Status: DC | PRN
  Administered 2018-02-27: 170 mg via INTRAVENOUS

## 2018-02-27 MED ORDER — FENTANYL CITRATE (PF) 100 MCG/2ML IJ SOLN
INTRAMUSCULAR | Status: DC | PRN
  Administered 2018-02-27 (×2): 100 ug via INTRAVENOUS
  Administered 2018-02-27: 50 ug via INTRAVENOUS

## 2018-02-27 MED ORDER — FENTANYL CITRATE (PF) 100 MCG/2ML IJ SOLN
25.0000 ug | INTRAMUSCULAR | Status: DC | PRN
  Administered 2018-02-27 (×2): 50 ug via INTRAVENOUS

## 2018-02-27 MED ORDER — NALOXONE HCL 0.4 MG/ML IJ SOLN: 0.4000 mg | INTRAMUSCULAR | Status: DC | PRN

## 2018-02-27 MED ORDER — SIMETHICONE 80 MG PO CHEW: 80.0000 mg | CHEWABLE_TABLET | Freq: Four times a day (QID) | ORAL | Status: DC | PRN

## 2018-02-27 MED ORDER — SUGAMMADEX SODIUM 200 MG/2ML IV SOLN
INTRAVENOUS | Status: AC
  Filled 2018-02-27: qty 2

## 2018-02-27 MED ORDER — ONDANSETRON HCL 4 MG/2ML IJ SOLN
INTRAMUSCULAR | Status: DC | PRN
  Administered 2018-02-27: 4 mg via INTRAVENOUS

## 2018-02-27 MED ORDER — HYDROMORPHONE 1 MG/ML IV SOLN
INTRAVENOUS | Status: DC
  Administered 2018-02-27: 30 mg via INTRAVENOUS
  Administered 2018-02-27: 2 mg via INTRAVENOUS
  Filled 2018-02-27: qty 30

## 2018-02-27 MED ORDER — PROPOFOL 10 MG/ML IV BOLUS
INTRAVENOUS | Status: AC
  Filled 2018-02-27: qty 20

## 2018-02-27 SURGICAL SUPPLY — 68 items
APL SRG 38 LTWT LNG FL B (MISCELLANEOUS) ×2
APPLICATOR ARISTA FLEXITIP XL (MISCELLANEOUS) ×2 IMPLANT
BLADE SURG 15 STRL LF C SS BP (BLADE) ×2 IMPLANT
BLADE SURG 15 STRL SS (BLADE) ×4
CABLE HIGH FREQUENCY MONO STRZ (ELECTRODE) IMPLANT
CANISTER SUCT 3000ML PPV (MISCELLANEOUS) ×4 IMPLANT
CATH ROBINSON RED A/P 16FR (CATHETERS) ×4 IMPLANT
CONT PATH 16OZ SNAP LID 3702 (MISCELLANEOUS) ×4 IMPLANT
COVER BACK TABLE 60X90IN (DRAPES) ×4 IMPLANT
COVER MAYO STAND STRL (DRAPES) ×4 IMPLANT
DECANTER SPIKE VIAL GLASS SM (MISCELLANEOUS) IMPLANT
DRAPE WARM FLUID 44X44 (DRAPE) IMPLANT
DRSG OPSITE POSTOP 3X4 (GAUZE/BANDAGES/DRESSINGS) IMPLANT
DRSG OPSITE POSTOP 4X10 (GAUZE/BANDAGES/DRESSINGS) ×4 IMPLANT
DURAPREP 26ML APPLICATOR (WOUND CARE) ×4 IMPLANT
ELECT REM PT RETURN 9FT ADLT (ELECTROSURGICAL) ×4
ELECTRODE REM PT RTRN 9FT ADLT (ELECTROSURGICAL) ×2 IMPLANT
FORCEPS CUTTING 45CM 5MM (CUTTING FORCEPS) ×4 IMPLANT
GAUZE 4X4 16PLY RFD (DISPOSABLE) IMPLANT
GLOVE BIO SURGEON STRL SZ8 (GLOVE) ×4 IMPLANT
GLOVE BIOGEL PI IND STRL 6.5 (GLOVE) ×2 IMPLANT
GLOVE BIOGEL PI IND STRL 7.0 (GLOVE) ×6 IMPLANT
GLOVE BIOGEL PI INDICATOR 6.5 (GLOVE) ×2
GLOVE BIOGEL PI INDICATOR 7.0 (GLOVE) ×6
GLOVE SURG ORTHO 8.0 STRL STRW (GLOVE) ×12 IMPLANT
GOWN STRL REUS W/TWL LRG LVL3 (GOWN DISPOSABLE) ×12 IMPLANT
HEMOSTAT ARISTA ABSORB 3G PWDR (MISCELLANEOUS) ×2 IMPLANT
HIBICLENS CHG 4% 4OZ BTL (MISCELLANEOUS) ×4 IMPLANT
LEGGING LITHOTOMY PAIR STRL (DRAPES) ×4 IMPLANT
LIGASURE IMPACT 36 18CM CVD LR (INSTRUMENTS) ×4 IMPLANT
NEEDLE INSUFFLATION 120MM (ENDOMECHANICALS) ×4 IMPLANT
NS IRRIG 1000ML POUR BTL (IV SOLUTION) ×4 IMPLANT
PACK ABDOMINAL GYN (CUSTOM PROCEDURE TRAY) ×4 IMPLANT
PACK LAVH (CUSTOM PROCEDURE TRAY) ×4 IMPLANT
PACK ROBOTIC GOWN (GOWN DISPOSABLE) ×4 IMPLANT
PACK TRENDGUARD 450 HYBRID PRO (MISCELLANEOUS) IMPLANT
PAD OB MATERNITY 4.3X12.25 (PERSONAL CARE ITEMS) ×4 IMPLANT
PROTECTOR NERVE ULNAR (MISCELLANEOUS) ×8 IMPLANT
RETRACTOR WND ALEXIS 25 LRG (MISCELLANEOUS) IMPLANT
RTRCTR WOUND ALEXIS 25CM LRG (MISCELLANEOUS)
SET IRRIG TUBING LAPAROSCOPIC (IRRIGATION / IRRIGATOR) ×2 IMPLANT
SLEEVE XCEL OPT CAN 5 100 (ENDOMECHANICALS) IMPLANT
SOLUTION ELECTROLUBE (MISCELLANEOUS) IMPLANT
SPONGE LAP 18X18 RF (DISPOSABLE) IMPLANT
SUT MNCRL 0 MO-4 VIOLET 18 CR (SUTURE) ×4 IMPLANT
SUT MNCRL 0 VIOLET 6X18 (SUTURE) ×2 IMPLANT
SUT MNCRL AB 0 CT1 27 (SUTURE) IMPLANT
SUT MNCRL AB 3-0 PS2 27 (SUTURE) ×4 IMPLANT
SUT MON AB 2-0 CT1 36 (SUTURE) IMPLANT
SUT MONOCRYL 0 6X18 (SUTURE) ×2
SUT MONOCRYL 0 MO 4 18  CR/8 (SUTURE) ×4
SUT PDS AB 0 CT1 27 (SUTURE) IMPLANT
SUT PDS AB 0 CTX 60 (SUTURE) IMPLANT
SUT PLAIN 3 0 CT 1 27 (SUTURE) ×4 IMPLANT
SUT VIC AB 0 CT1 18XCR BRD8 (SUTURE) IMPLANT
SUT VIC AB 0 CT1 8-18 (SUTURE)
SUT VIC AB 1 CTX 36 (SUTURE)
SUT VIC AB 1 CTX36XBRD ANBCTRL (SUTURE) IMPLANT
SUT VIC AB 2-0 CT1 (SUTURE) ×4 IMPLANT
SUT VICRYL 0 UR6 27IN ABS (SUTURE) ×4 IMPLANT
SUT VICRYL RAPIDE 3 0 (SUTURE) ×4 IMPLANT
TOWEL OR 17X24 6PK STRL BLUE (TOWEL DISPOSABLE) ×8 IMPLANT
TRAY FOLEY W/BAG SLVR 14FR (SET/KITS/TRAYS/PACK) ×4 IMPLANT
TRENDGUARD 450 HYBRID PRO PACK (MISCELLANEOUS) ×4
TROCAR OPTI TIP 5M 100M (ENDOMECHANICALS) ×4 IMPLANT
TROCAR XCEL DIL TIP R 11M (ENDOMECHANICALS) ×4 IMPLANT
TUBING INSUF HEATED (TUBING) ×4 IMPLANT
WARMER LAPAROSCOPE (MISCELLANEOUS) ×4 IMPLANT

## 2018-02-27 NOTE — Op Note (Signed)
Elizabeth Carpenter, Elizabeth Carpenter MEDICAL RECORD [id number] ACCOUNT [id number] DATE OF BIRTH:[birth year] FACILITY: [facility] LOCATION: [id number] Heppner, MD  OPERATIVE REPORT  DATE OF PROCEDURE:  02/27/2018  PREOPERATIVE DIAGNOSIS:  Fourteen week size fibroids, pelvic fullness and pain.  POSTOPERATIVE DIAGNOSIS:  Fourteen week size fibroids, pelvic fullness and pain.  PROCEDURE:  Laparoscopic assisted vaginal hysterectomy with bilateral salpingo-oophorectomy.  SURGEON:  Louretta Shorten, MD  ASSISTANT:  Arvella Nigh, MD  ANESTHESIA:  General endotracheal.  INDICATIONS:  The patient is a 58 year old postmenopausal white female with worsening pelvic pain and pressure.  Ultrasound shows multiple large fibroids and an overall 14-week size fibroid uterus.  She desires definitive surgical intervention, presents  for hysterectomy with bilateral salpingo-oophorectomy.  Risks and benefits were discussed at length and informed consent was obtained.  FINDINGS AT TIME OF SURGERY:  A 14-week size fibroid uterus.  Evidence of endometriosis with adhesions from the left and right ovary to the pelvic sidewall.  Unable to see the appendix.  The liver appeared to be normal.  DESCRIPTION OF PROCEDURE:  After adequate analgesia, the patient was placed in the dorsal lithotomy position.  She was sterilely prepped and draped.  Bladder was sterilely drained.  Graves speculum was placed.  A Cohen tenaculum was placed on the cervix.   A 1-cm infraumbilical skin incision was made.  A Veress needle was inserted.  The abdomen was insufflated to dullness and percussion.  The 11 mm trocar inserted.  Laparoscope was inserted.  The above findings noted.  A 5 mm trocar was inserted to the  left of the midline under direct visualization.  The Gyrus cutting forceps were used to coagulate and dissect across the right infundibulopelvic ligament down across the round ligament on the right and left side, freeing adhesions from the  pelvic  sidewall to the ovary, freeing the ovary up and as it falls towards the uterus after released from the round ligament and the IFP.  This was done bilaterally with good hemostasis and care taken to avoid the underlying ureter.  Bladder flap was identified  and incised to create a small bladder flap between the bladder and the uterus.  Abdomen was then desufflated.  Legs were repositioned.  A posterior colpotomy was performed.  Cervix was circumscribed with Bovie cautery.  The LigaSure instrument was used  to ligate across the uterosacral ligaments bilaterally, the cardinal ligaments and the bladder pillars.  The anterior vaginal mucosa was incised and reflected off the anterior surface of the cervix.  A small window in the anterior peritoneum was noted  and a Deaver retractor was placed underneath the bladder.  The LigaSure instrument was used to ligate across the uterine vasculature up to the inferior portion of the round ligament bilaterally with good hemostasis achieved.  The uterus was removed with  the fallopian tubes and ovaries intact.  Uterosacral ligaments were identified, sutured again with figure-of-eight of 0 Monocryl suture.  Posterior peritoneum was closed in a pursestring fashion with 0 Monocryl suture.  The uterosacral ligaments were  then plicated in the midline and the vagina closed in the vertical fashion using figure-of-eights of 0 Monocryl suture with good approximation and good hemostasis was noted.  Foley catheter was placed with return of clear yellow urine.  The legs were  repositioned.  Abdomen reinsufflated and ____ suction irrigator was used to irrigate the abdomen and pelvis.  Small areas of bleeding were coagulated with bipolar cautery.  After copious irrigation and adequate hemostasis, the pedicles appeared to be  hemostatic.  Ureters were noted bilaterally with good peristalsis noted.  Some Arista was placed in the posterior cul-de-sac due to some peritoneal bleeding  from the denuded area with good hemostasis noted.  The abdomen was then desufflated, trocars were  removed and the infraumbilical skin incision was closed with 0 Vicryl interrupted suture in the fascia, 3-0 Vicryl Rapide subcuticular suture.  The 5 mm site was closed with 2 interrupted sutures of 3-0 Vicryl Rapide.  Incisions were injected with 0.25%  Marcaine, a total of 10 mL used.  The patient was stable and transferred to recovery room.  Sponge and needle count was normal x3.  Estimated blood loss was 250 mL.  The patient received gentamicin and clindamycin preoperatively.  The weight of the  uterus was 276 grams.  TN/NUANCE  D:02/27/2018 T:02/27/2018 JOB:[id number]

## 2018-02-27 NOTE — Brief Op Note (Signed)
02/27/2018  9:05 AM  PATIENT:  Elizabeth Carpenter  58 y.o. female  PRE-OPERATIVE DIAGNOSIS:  14 week size fibroids, pelvic pain  POST-OPERATIVE DIAGNOSIS:  14 week size fibroids, pelvic pain  PROCEDURE:  Procedure(s): LAPAROSCOPIC ASSISTED VAGINAL HYSTERECTOMY WITH SALPINGO OOPHORECTOMY (Bilateral) possible HYSTERECTOMY ABDOMINAL WITH SALPINGO-OOPHORECTOMY (Bilateral)  SURGEON:  Surgeon(s) and Role:    Louretta Shorten, MD - Primary  PHYSICIAN ASSISTANT:   ASSISTANTS: [place]  ANESTHESIA:   general  EBL:  250 mL   BLOOD ADMINISTERED:none  DRAINS: Urinary Catheter (Foley)   LOCAL MEDICATIONS USED:  MARCAINE     SPECIMEN:  Source of Specimen:  uterus tubes and ovaries  DISPOSITION OF SPECIMEN:  PATHOLOGY  COUNTS:  YES  TOURNIQUET:  * No tourniquets in log *  DICTATION: .Other Dictation: Dictation Number 1  PLAN OF CARE: Admit for overnight observation  PATIENT DISPOSITION:  PACU - hemodynamically stable.   Delay start of Pharmacological VTE agent (>24hrs) due to surgical blood loss or risk of bleeding: not applicable

## 2018-02-27 NOTE — Anesthesia Preprocedure Evaluation (Addendum)
Anesthesia Evaluation  Patient identified by MRN, date of birth, ID band Patient awake    Reviewed: Allergy & Precautions, NPO status , Patient's Chart, lab work & pertinent test results  History of Anesthesia Complications Negative for: history of anesthetic complications  Airway Mallampati: II  TM Distance: >3 FB Neck ROM: Full    Dental  (+) Dental Advisory Given   Pulmonary neg pulmonary ROS,    breath sounds clear to auscultation       Cardiovascular negative cardio ROS   Rhythm:Regular Rate:Normal     Neuro/Psych negative neurological ROS  negative psych ROS   GI/Hepatic negative GI ROS, Neg liver ROS,   Endo/Other  negative endocrine ROS Obesity   Renal/GU negative Renal ROS     Musculoskeletal negative musculoskeletal ROS (+)   Abdominal   Peds  Hematology negative hematology ROS (+)   Anesthesia Other Findings   Reproductive/Obstetrics  Fibroids                             Anesthesia Physical Anesthesia Plan  ASA: II  Anesthesia Plan: General   Post-op Pain Management:    Induction: Intravenous  PONV Risk Score and Plan: 4 or greater and Treatment may vary due to age or medical condition, Ondansetron, Dexamethasone and Midazolam  Airway Management Planned: Oral ETT  Additional Equipment: None  Intra-op Plan:   Post-operative Plan: Extubation in OR  Informed Consent: I have reviewed the patients History and Physical, chart, labs and discussed the procedure including the risks, benefits and alternatives for the proposed anesthesia with the patient or authorized representative who has indicated his/her understanding and acceptance.   Dental advisory given  Plan Discussed with: CRNA and Anesthesiologist  Anesthesia Plan Comments:       Anesthesia Quick Evaluation

## 2018-02-27 NOTE — Anesthesia Procedure Notes (Signed)
Procedure Name: Intubation Date/Time: 02/27/2018 7:38 AM Performed by: Jonna Munro, CRNA Pre-anesthesia Checklist: Patient identified, Emergency Drugs available, Suction available, Patient being monitored and Timeout performed Patient Re-evaluated:Patient Re-evaluated prior to induction Oxygen Delivery Method: Circle system utilized Preoxygenation: Pre-oxygenation with 100% oxygen Induction Type: IV induction Laryngoscope Size: Mac and 3 Grade View: Grade I Tube type: Oral Number of attempts: 1 Airway Equipment and Method: Stylet Placement Confirmation: ETT inserted through vocal cords under direct vision,  positive ETCO2 and breath sounds checked- equal and bilateral Secured at: 22 cm Tube secured with: Tape Dental Injury: Teeth and Oropharynx as per pre-operative assessment

## 2018-02-27 NOTE — Transfer of Care (Signed)
Immediate Anesthesia Transfer of Care Note  Patient: Elizabeth Carpenter  Procedure(s) Performed: LAPAROSCOPIC ASSISTED VAGINAL HYSTERECTOMY WITH SALPINGO OOPHORECTOMY (Bilateral Abdomen) possible HYSTERECTOMY ABDOMINAL WITH SALPINGO-OOPHORECTOMY (Bilateral )  Patient Location: PACU  Anesthesia Type:General  Level of Consciousness: awake, alert  and oriented  Airway & Oxygen Therapy: Patient Spontanous Breathing  Post-op Assessment: Report given to RN and Post -op Vital signs reviewed and stable  Post vital signs: Reviewed and stable  Last Vitals:  Vitals Value Taken Time  BP 123/78 02/27/2018  9:09 AM  Temp    Pulse 77 02/27/2018  9:11 AM  Resp 15 02/27/2018  9:11 AM  SpO2 99 % 02/27/2018  9:11 AM  Vitals shown include unvalidated device data.  Last Pain:  Vitals:   02/27/18 0621  TempSrc: Oral  PainSc: 0-No pain      Patients Stated Pain Goal: 3 (59/09/31 1216)  Complications: No apparent anesthesia complications

## 2018-02-27 NOTE — Anesthesia Postprocedure Evaluation (Signed)
Anesthesia Post Note  Patient: Elizabeth Carpenter  Procedure(s) Performed: LAPAROSCOPIC ASSISTED VAGINAL HYSTERECTOMY WITH SALPINGO OOPHORECTOMY (Bilateral Abdomen) possible HYSTERECTOMY ABDOMINAL WITH SALPINGO-OOPHORECTOMY (Bilateral )     Patient location during evaluation: PACU Anesthesia Type: General Level of consciousness: awake and alert Pain management: pain level controlled Vital Signs Assessment: post-procedure vital signs reviewed and stable Respiratory status: spontaneous breathing, nonlabored ventilation and respiratory function stable Cardiovascular status: blood pressure returned to baseline and stable Postop Assessment: no apparent nausea or vomiting Anesthetic complications: no    Last Vitals:  Vitals:   02/27/18 1049 02/27/18 1055  BP:  117/63  Pulse:  68  Resp: 18 18  Temp:  36.9 C  SpO2: 98%     Last Pain:  Vitals:   02/27/18 1100  TempSrc:   PainSc: 4    Pain Goal: Patients Stated Pain Goal: 3 (02/27/18 1100)               Audry Pili

## 2018-02-28 ENCOUNTER — Encounter (HOSPITAL_COMMUNITY): Payer: Self-pay | Admitting: Obstetrics and Gynecology

## 2018-02-28 DIAGNOSIS — D649 Anemia, unspecified: Secondary | ICD-10-CM | POA: Diagnosis not present

## 2018-02-28 DIAGNOSIS — E669 Obesity, unspecified: Secondary | ICD-10-CM | POA: Diagnosis not present

## 2018-02-28 DIAGNOSIS — D251 Intramural leiomyoma of uterus: Secondary | ICD-10-CM | POA: Diagnosis not present

## 2018-02-28 DIAGNOSIS — Z880 Allergy status to penicillin: Secondary | ICD-10-CM | POA: Diagnosis not present

## 2018-02-28 DIAGNOSIS — Z885 Allergy status to narcotic agent status: Secondary | ICD-10-CM | POA: Diagnosis not present

## 2018-02-28 DIAGNOSIS — N736 Female pelvic peritoneal adhesions (postinfective): Secondary | ICD-10-CM | POA: Diagnosis not present

## 2018-02-28 DIAGNOSIS — N9489 Other specified conditions associated with female genital organs and menstrual cycle: Secondary | ICD-10-CM | POA: Diagnosis not present

## 2018-02-28 DIAGNOSIS — D270 Benign neoplasm of right ovary: Secondary | ICD-10-CM | POA: Diagnosis not present

## 2018-02-28 DIAGNOSIS — Z6830 Body mass index (BMI) 30.0-30.9, adult: Secondary | ICD-10-CM | POA: Diagnosis not present

## 2018-02-28 DIAGNOSIS — D250 Submucous leiomyoma of uterus: Secondary | ICD-10-CM | POA: Diagnosis not present

## 2018-02-28 DIAGNOSIS — Z791 Long term (current) use of non-steroidal anti-inflammatories (NSAID): Secondary | ICD-10-CM | POA: Diagnosis not present

## 2018-02-28 DIAGNOSIS — D259 Leiomyoma of uterus, unspecified: Secondary | ICD-10-CM | POA: Diagnosis not present

## 2018-02-28 DIAGNOSIS — R102 Pelvic and perineal pain: Secondary | ICD-10-CM | POA: Diagnosis not present

## 2018-02-28 DIAGNOSIS — D252 Subserosal leiomyoma of uterus: Secondary | ICD-10-CM | POA: Diagnosis not present

## 2018-02-28 DIAGNOSIS — N802 Endometriosis of fallopian tube: Secondary | ICD-10-CM | POA: Diagnosis not present

## 2018-02-28 LAB — CBC
HCT: 35.8 % — ABNORMAL LOW (ref 36.0–46.0)
Hemoglobin: 12.1 g/dL (ref 12.0–15.0)
MCH: 29.7 pg (ref 26.0–34.0)
MCHC: 33.8 g/dL (ref 30.0–36.0)
MCV: 88 fL (ref 80.0–100.0)
NRBC: 0 % (ref 0.0–0.2)
Platelets: 182 10*3/uL (ref 150–400)
RBC: 4.07 MIL/uL (ref 3.87–5.11)
RDW: 12.9 % (ref 11.5–15.5)
WBC: 8.2 10*3/uL (ref 4.0–10.5)

## 2018-02-28 MED ORDER — IBUPROFEN 600 MG PO TABS: 600.0000 mg | ORAL_TABLET | Freq: Four times a day (QID) | ORAL | 0 refills | Status: DC | PRN

## 2018-02-28 MED ORDER — OXYCODONE HCL 5 MG PO TABS: 5.0000 mg | ORAL_TABLET | Freq: Four times a day (QID) | ORAL | 0 refills | Status: DC | PRN

## 2018-02-28 NOTE — Discharge Summary (Signed)
Physician Discharge Summary  Patient ID: Elizabeth Carpenter MRN: [id number] DOB/AGE: [birth year] 56 y.o.  Admit date: 02/27/2018 Discharge date: 02/28/2018  Admission Diagnoses:14 weeks sized fibroids, pelvic pain  Discharge Diagnoses: same Active Problems:   Leiomyoma of uterus   S/P laparoscopic assisted vaginal hysterectomy (LAVH)   Discharged Condition: good  Hospital Course: pt underwent an uncomplicated LAVH/BSO with EBL 250cc.Marland Kitchen  Her post op course was unremarkable with quick return of bowel and bladder function.  Pain well controlled on Ibuprofen and on pod 1 desired d/c home.  Consults: None  Significant Diagnostic Studies: labs: hgb 12.1 post op  Treatments: surgery:   LAVH/BSO  Discharge Exam: Blood pressure 125/73, pulse 66, temperature 98 F (36.7 C), temperature source Oral, resp. rate 16, height 5' 6.5" (1.689 m), weight 87.1 kg, SpO2 98 %. General appearance: alert, cooperative, appears stated age and no distress GI: soft, non-tender; bowel sounds normal; no masses,  no organomegaly Incision/Wound:CD&I  Disposition: Discharge disposition: 01-Home or Self Care       Discharge Instructions    Call MD for:  difficulty breathing, headache or visual disturbances   Complete by:  As directed    Call MD for:  persistant nausea and vomiting   Complete by:  As directed    Call MD for:  redness, tenderness, or signs of infection (pain, swelling, redness, odor or green/yellow discharge around incision site)   Complete by:  As directed    Call MD for:  severe uncontrolled pain   Complete by:  As directed    Call MD for:  temperature >100.4   Complete by:  As directed    Diet general   Complete by:  As directed    Driving Restrictions   Complete by:  As directed    No driving for 2 weeks   Increase activity slowly   Complete by:  As directed    Lifting restrictions   Complete by:  As directed    No lifting anything greater than 10 pounds (if you have to ask, don't  lift it)   Sexual Activity Restrictions   Complete by:  As directed    Nothing in the vagina for 6 weeks     Allergies as of 02/28/2018      Reactions   Morphine And Related Nausea And Vomiting   Amoxicillin Rash      Medication List    TAKE these medications   guaifenesin 400 MG Tabs tablet Commonly known as:  HUMIBID E Take 400 mg by mouth daily as needed (for congestion).   ibuprofen 600 MG tablet Commonly known as:  ADVIL,MOTRIN Take 1 tablet (600 mg total) by mouth every 6 (six) hours as needed for mild pain. What changed:  You were already taking a medication with the same name, and this prescription was added. Make sure you understand how and when to take each.   ibuprofen 200 MG tablet Commonly known as:  ADVIL,MOTRIN Take 400 mg by mouth every 6 (six) hours as needed for headache or moderate pain. What changed:  Another medication with the same name was added. Make sure you understand how and when to take each.   loratadine 10 MG tablet Commonly known as:  CLARITIN Take 10 mg by mouth daily as needed for allergies.   oxyCODONE 5 MG immediate release tablet Commonly known as:  Oxy IR/ROXICODONE Take 1 tablet (5 mg total) by mouth every 6 (six) hours as needed for moderate pain.   vitamin B-12 1000 MCG  tablet Commonly known as:  CYANOCOBALAMIN Take 1,000 mcg by mouth daily.   Vitamin D3 125 MCG (5000 UT) Caps Take 5,000 Units by mouth daily.        Signed: Luz Lex 02/28/2018, 9:06 AM

## 2018-02-28 NOTE — Progress Notes (Signed)
Patient discharged home with husband. Discharge teaching, home care, prescriptions, follow-up appts, and reasons to seek care discussed. Pt verbalized understanding.

## 2018-05-29 DIAGNOSIS — M9902 Segmental and somatic dysfunction of thoracic region: Secondary | ICD-10-CM | POA: Diagnosis not present

## 2018-05-29 DIAGNOSIS — M9904 Segmental and somatic dysfunction of sacral region: Secondary | ICD-10-CM | POA: Diagnosis not present

## 2018-05-29 DIAGNOSIS — M9901 Segmental and somatic dysfunction of cervical region: Secondary | ICD-10-CM | POA: Diagnosis not present

## 2018-05-29 DIAGNOSIS — M9903 Segmental and somatic dysfunction of lumbar region: Secondary | ICD-10-CM | POA: Diagnosis not present

## 2018-06-02 DIAGNOSIS — M9903 Segmental and somatic dysfunction of lumbar region: Secondary | ICD-10-CM | POA: Diagnosis not present

## 2018-06-02 DIAGNOSIS — M9904 Segmental and somatic dysfunction of sacral region: Secondary | ICD-10-CM | POA: Diagnosis not present

## 2018-06-02 DIAGNOSIS — M9901 Segmental and somatic dysfunction of cervical region: Secondary | ICD-10-CM | POA: Diagnosis not present

## 2018-06-02 DIAGNOSIS — M9902 Segmental and somatic dysfunction of thoracic region: Secondary | ICD-10-CM | POA: Diagnosis not present

## 2018-06-05 DIAGNOSIS — M9904 Segmental and somatic dysfunction of sacral region: Secondary | ICD-10-CM | POA: Diagnosis not present

## 2018-06-05 DIAGNOSIS — M9901 Segmental and somatic dysfunction of cervical region: Secondary | ICD-10-CM | POA: Diagnosis not present

## 2018-06-05 DIAGNOSIS — M9903 Segmental and somatic dysfunction of lumbar region: Secondary | ICD-10-CM | POA: Diagnosis not present

## 2018-06-05 DIAGNOSIS — M9902 Segmental and somatic dysfunction of thoracic region: Secondary | ICD-10-CM | POA: Diagnosis not present

## 2018-12-10 DIAGNOSIS — L814 Other melanin hyperpigmentation: Secondary | ICD-10-CM | POA: Diagnosis not present

## 2018-12-10 DIAGNOSIS — D1801 Hemangioma of skin and subcutaneous tissue: Secondary | ICD-10-CM | POA: Diagnosis not present

## 2018-12-10 DIAGNOSIS — L570 Actinic keratosis: Secondary | ICD-10-CM | POA: Diagnosis not present

## 2018-12-10 DIAGNOSIS — Z85828 Personal history of other malignant neoplasm of skin: Secondary | ICD-10-CM | POA: Diagnosis not present

## 2018-12-10 DIAGNOSIS — D225 Melanocytic nevi of trunk: Secondary | ICD-10-CM | POA: Diagnosis not present

## 2019-03-09 DIAGNOSIS — Z1231 Encounter for screening mammogram for malignant neoplasm of breast: Secondary | ICD-10-CM | POA: Diagnosis not present

## 2019-04-15 DIAGNOSIS — L570 Actinic keratosis: Secondary | ICD-10-CM | POA: Diagnosis not present

## 2019-05-12 DIAGNOSIS — Z23 Encounter for immunization: Secondary | ICD-10-CM | POA: Diagnosis not present

## 2019-06-02 DIAGNOSIS — J309 Allergic rhinitis, unspecified: Secondary | ICD-10-CM | POA: Diagnosis not present

## 2019-06-16 DIAGNOSIS — Z6830 Body mass index (BMI) 30.0-30.9, adult: Secondary | ICD-10-CM | POA: Diagnosis not present

## 2019-06-16 DIAGNOSIS — N952 Postmenopausal atrophic vaginitis: Secondary | ICD-10-CM | POA: Diagnosis not present

## 2019-06-16 DIAGNOSIS — Z01419 Encounter for gynecological examination (general) (routine) without abnormal findings: Secondary | ICD-10-CM | POA: Diagnosis not present

## 2019-12-29 DIAGNOSIS — Z85828 Personal history of other malignant neoplasm of skin: Secondary | ICD-10-CM | POA: Diagnosis not present

## 2019-12-29 DIAGNOSIS — L820 Inflamed seborrheic keratosis: Secondary | ICD-10-CM | POA: Diagnosis not present

## 2019-12-29 DIAGNOSIS — L853 Xerosis cutis: Secondary | ICD-10-CM | POA: Diagnosis not present

## 2019-12-29 DIAGNOSIS — L814 Other melanin hyperpigmentation: Secondary | ICD-10-CM | POA: Diagnosis not present

## 2020-03-16 DIAGNOSIS — M9904 Segmental and somatic dysfunction of sacral region: Secondary | ICD-10-CM | POA: Diagnosis not present

## 2020-03-16 DIAGNOSIS — M9902 Segmental and somatic dysfunction of thoracic region: Secondary | ICD-10-CM | POA: Diagnosis not present

## 2020-03-16 DIAGNOSIS — M9901 Segmental and somatic dysfunction of cervical region: Secondary | ICD-10-CM | POA: Diagnosis not present

## 2020-03-16 DIAGNOSIS — M9903 Segmental and somatic dysfunction of lumbar region: Secondary | ICD-10-CM | POA: Diagnosis not present

## 2020-03-30 DIAGNOSIS — M9904 Segmental and somatic dysfunction of sacral region: Secondary | ICD-10-CM | POA: Diagnosis not present

## 2020-03-30 DIAGNOSIS — M9902 Segmental and somatic dysfunction of thoracic region: Secondary | ICD-10-CM | POA: Diagnosis not present

## 2020-03-30 DIAGNOSIS — M9901 Segmental and somatic dysfunction of cervical region: Secondary | ICD-10-CM | POA: Diagnosis not present

## 2020-03-30 DIAGNOSIS — M9903 Segmental and somatic dysfunction of lumbar region: Secondary | ICD-10-CM | POA: Diagnosis not present

## 2020-04-13 DIAGNOSIS — M9902 Segmental and somatic dysfunction of thoracic region: Secondary | ICD-10-CM | POA: Diagnosis not present

## 2020-04-13 DIAGNOSIS — M9901 Segmental and somatic dysfunction of cervical region: Secondary | ICD-10-CM | POA: Diagnosis not present

## 2020-04-13 DIAGNOSIS — M9904 Segmental and somatic dysfunction of sacral region: Secondary | ICD-10-CM | POA: Diagnosis not present

## 2020-04-13 DIAGNOSIS — M9903 Segmental and somatic dysfunction of lumbar region: Secondary | ICD-10-CM | POA: Diagnosis not present

## 2020-06-06 DIAGNOSIS — M9902 Segmental and somatic dysfunction of thoracic region: Secondary | ICD-10-CM | POA: Diagnosis not present

## 2020-06-06 DIAGNOSIS — M9901 Segmental and somatic dysfunction of cervical region: Secondary | ICD-10-CM | POA: Diagnosis not present

## 2020-06-06 DIAGNOSIS — M9903 Segmental and somatic dysfunction of lumbar region: Secondary | ICD-10-CM | POA: Diagnosis not present

## 2020-06-06 DIAGNOSIS — M9904 Segmental and somatic dysfunction of sacral region: Secondary | ICD-10-CM | POA: Diagnosis not present

## 2020-06-29 DIAGNOSIS — M9904 Segmental and somatic dysfunction of sacral region: Secondary | ICD-10-CM | POA: Diagnosis not present

## 2020-06-29 DIAGNOSIS — M9903 Segmental and somatic dysfunction of lumbar region: Secondary | ICD-10-CM | POA: Diagnosis not present

## 2020-06-29 DIAGNOSIS — M9902 Segmental and somatic dysfunction of thoracic region: Secondary | ICD-10-CM | POA: Diagnosis not present

## 2020-06-29 DIAGNOSIS — M9901 Segmental and somatic dysfunction of cervical region: Secondary | ICD-10-CM | POA: Diagnosis not present

## 2020-07-28 ENCOUNTER — Encounter (HOSPITAL_BASED_OUTPATIENT_CLINIC_OR_DEPARTMENT_OTHER): Payer: Self-pay | Admitting: Nurse Practitioner

## 2020-07-28 ENCOUNTER — Other Ambulatory Visit: Payer: Self-pay

## 2020-07-28 ENCOUNTER — Other Ambulatory Visit (HOSPITAL_BASED_OUTPATIENT_CLINIC_OR_DEPARTMENT_OTHER)
Admission: RE | Admit: 2020-07-28 | Discharge: 2020-07-28 | Disposition: A | Discharge status: Home / Self Care | Payer: BC Managed Care – PPO | Source: Ambulatory Visit | Attending: Nurse Practitioner | Admitting: Nurse Practitioner

## 2020-07-28 ENCOUNTER — Ambulatory Visit (HOSPITAL_BASED_OUTPATIENT_CLINIC_OR_DEPARTMENT_OTHER): Payer: BLUE CROSS/BLUE SHIELD | Admitting: Nurse Practitioner

## 2020-07-28 VITALS — BP 117/90 | HR 73 | Ht 67.0 in | Wt 191.4 lb

## 2020-07-28 DIAGNOSIS — Z130 Encounter for screening for diseases of the blood and blood-forming organs and certain disorders involving the immune mechanism: Secondary | ICD-10-CM | POA: Insufficient documentation

## 2020-07-28 DIAGNOSIS — Z1329 Encounter for screening for other suspected endocrine disorder: Secondary | ICD-10-CM | POA: Diagnosis not present

## 2020-07-28 DIAGNOSIS — Z7689 Persons encountering health services in other specified circumstances: Secondary | ICD-10-CM | POA: Diagnosis not present

## 2020-07-28 DIAGNOSIS — Z8349 Family history of other endocrine, nutritional and metabolic diseases: Secondary | ICD-10-CM | POA: Diagnosis not present

## 2020-07-28 DIAGNOSIS — Z1321 Encounter for screening for nutritional disorder: Secondary | ICD-10-CM

## 2020-07-28 DIAGNOSIS — Z13220 Encounter for screening for lipoid disorders: Secondary | ICD-10-CM | POA: Diagnosis not present

## 2020-07-28 DIAGNOSIS — Z833 Family history of diabetes mellitus: Secondary | ICD-10-CM | POA: Insufficient documentation

## 2020-07-28 DIAGNOSIS — Z13228 Encounter for screening for other metabolic disorders: Secondary | ICD-10-CM | POA: Insufficient documentation

## 2020-07-28 DIAGNOSIS — Z0000 Encounter for general adult medical examination without abnormal findings: Secondary | ICD-10-CM | POA: Diagnosis not present

## 2020-07-28 DIAGNOSIS — Z6829 Body mass index (BMI) 29.0-29.9, adult: Secondary | ICD-10-CM | POA: Insufficient documentation

## 2020-07-28 LAB — LIPID PANEL
Cholesterol: 245 mg/dL — ABNORMAL HIGH (ref 0–200)
HDL: 72 mg/dL (ref >40)
LDL Cholesterol: 154 mg/dL — ABNORMAL HIGH (ref 0–99)
Total CHOL/HDL Ratio: 3.4 RATIO
Triglycerides: 93 mg/dL (ref <150)
VLDL: 19 mg/dL (ref 0–40)

## 2020-07-28 LAB — CBC WITH DIFFERENTIAL/PLATELET
Abs Immature Granulocytes: 0.02 10*3/uL (ref 0.00–0.07)
Basophils Absolute: 0 10*3/uL (ref 0.0–0.1)
Basophils Relative: 1 %
Eosinophils Absolute: 0.1 10*3/uL (ref 0.0–0.5)
Eosinophils Relative: 2 %
HCT: 41.9 % (ref 36.0–46.0)
Hemoglobin: 14.1 g/dL (ref 12.0–15.0)
Immature Granulocytes: 0 %
Lymphocytes Relative: 35 %
Lymphs Abs: 1.8 10*3/uL (ref 0.7–4.0)
MCH: 29.3 pg (ref 26.0–34.0)
MCHC: 33.7 g/dL (ref 30.0–36.0)
MCV: 86.9 fL (ref 80.0–100.0)
Monocytes Absolute: 0.5 10*3/uL (ref 0.1–1.0)
Monocytes Relative: 10 %
Neutro Abs: 2.7 10*3/uL (ref 1.7–7.7)
Neutrophils Relative %: 52 %
Platelets: 238 10*3/uL (ref 150–400)
RBC: 4.82 MIL/uL (ref 3.87–5.11)
RDW: 12.5 % (ref 11.5–15.5)
WBC: 5.1 10*3/uL (ref 4.0–10.5)
nRBC: 0 % (ref 0.0–0.2)

## 2020-07-28 LAB — COMPREHENSIVE METABOLIC PANEL
ALT: 23 U/L (ref 0–44)
AST: 16 U/L (ref 15–41)
Albumin: 4.5 g/dL (ref 3.5–5.0)
Alkaline Phosphatase: 38 U/L (ref 38–126)
Anion gap: 9 (ref 5–15)
BUN: 16 mg/dL (ref 6–20)
CO2: 27 mmol/L (ref 22–32)
Calcium: 9.3 mg/dL (ref 8.9–10.3)
Chloride: 104 mmol/L (ref 98–111)
Creatinine, Ser: 0.82 mg/dL (ref 0.44–1.00)
GFR, Estimated: >60 mL/min (ref >60)
Glucose, Bld: 98 mg/dL (ref 70–99)
Potassium: 4.2 mmol/L (ref 3.5–5.1)
Sodium: 140 mmol/L (ref 135–145)
Total Bilirubin: 0.6 mg/dL (ref 0.3–1.2)
Total Protein: 7.4 g/dL (ref 6.5–8.1)

## 2020-07-28 NOTE — Assessment & Plan Note (Signed)
Sx of dryness to oral mm mainly at night, weight gain, and skin dryness. Mother hx of DM and thyroid d/o Will check A1c and TSH today No s/s consistent with Sjogren presently Will monitor

## 2020-07-28 NOTE — Assessment & Plan Note (Signed)
Review of current and past medical history, social history, medication, and family history.  Review of care gaps and health maintenance recommendations.  Records from recent providers to be requested if not available in Chart Review or Care Everywhere.  Recommendations for health maintenance, diet, and exercise provided.   

## 2020-07-28 NOTE — Assessment & Plan Note (Signed)
BMI 29.98 today Diet and exercise recommendations provided She has recently joined the gym with plans to become more active. Will follow

## 2020-07-28 NOTE — Assessment & Plan Note (Addendum)
Sx of dryness to oral mm mainly at night, weight gain, and skin dryness. Mother hx of DM and thyroid d/o Will check A1c and TSH today No s/s consistent with Sjogren presently Will monitor

## 2020-07-28 NOTE — Assessment & Plan Note (Signed)
CPE today with no concerning findings.  Will obtain previous records and update HM and chart  Colonoscopy reported with [facility] at age [age]. Mammogram 05/2019 with [facility] Pap [facility]- no longer required Shingrix 2020 Unsure of Tdap status

## 2020-07-28 NOTE — Progress Notes (Signed)
Worthy Keeler, DNP, AGNP-c Primary Care Services ______________________________________________________________________________________________________________________________________________  HPI Elizabeth Carpenter is a 60 y.o. female presenting to [facility] [place] today to establish care.   Patient Care Team: Anupama Piehl, Coralee Pesa, NP as PCP - General (Nurse Practitioner) Last CPE: 2019    Last PCP: Dr. Rachell Cipro Also seen by [facility] for colonoscopy  Concerns today: . Dry Mouth o Started "a couple" of months ago o Waking in middle of night with dry mouth, "tongue feels stuck to the roof of my mouth" o No new medications, oral care products prior to onset o Starting using Biotene at bedtime which has helped some o Sleeps with mouth closed, no cpap/bipap or oral appliances, feels that allergies have been a little worse lately, takes OTC loratadine during the day (long term) o No dry mouth during the day, no excessive thirst or urination, no dry eyes or nose o Some dry skin on legs, vaginal dryness post menopause (long term) o Mother has thyroid d/o and DM2 . Menopause o Menopause in Elizabeth Carpenter 46's. Total hysterectomy in Jan 2020 d/y fibroids o Endorses hot flashes, mood swings, decreased energy, decreased libido, vaginal dryness o Has been taking Estrovan, rhubarb OTC supplement which seems to help some with hot flashes o Maternal aunt hx of breast cancer x 2 o Prescribed estrogen vaginal cream but has not started using  o Genetic testing for Br Ca showed 25% chance of development   Narrative: Elizabeth Carpenter is married- she has 2 adult children- both are in college.  She reports she is safe in his current relationships and home environment. She denies hx of partner abuse.  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   She endorses a mixture of healthy and unhealthy dietary choices, she  has recently joined the gym in this facility and plans to start exercising regularly.   She denies nicotine or recreational drug use. She endorses alcohol use in moderation. No concerns for over consumption by self, friends, or family.  She is currently sexually active with one female partner, her husband. She denies concerns for STI today and declines testing.   She denies recent changes to bowel habits, denies recent changes to bladder habits, denies recent changes to skin.  She denies recent mood related changes. PHQ and GAD listed below.  PHQ9 Today: Depression screen PHQ 2/9 07/28/2020  Decreased Interest 0  Down, Depressed, Hopeless 0  PHQ - 2 Score 0  Altered sleeping 1  Tired, decreased energy 1  Change in appetite 0  Feeling bad or failure about yourself  0  Trouble concentrating 0  Moving slowly or fidgety/restless 0  Suicidal thoughts 0  PHQ-9 Score 2  Difficult doing work/chores Not difficult at all   GAD7 Today: GAD 7 : Generalized Anxiety Score 07/28/2020  Nervous, Anxious, on Edge 0  Control/stop worrying 0  Worry too much - different things 0  Trouble relaxing 0  Restless 0  Easily annoyed or irritable 0  Afraid - awful might happen 0  Total GAD 7 Score 0    Health Maintenance Due  Topic Date Due  . COVID-19 Vaccine (1) Never done  . HIV Screening  Never done  . Hepatitis C Screening  Never done  . TETANUS/TDAP  Never done  . PAP SMEAR-Modifier  Never done  . COLONOSCOPY (Pts 45-62yrs Insurance coverage will need to be confirmed)  Never done  . Zoster Vaccines- Shingrix (  1 of 2) Never done  . MAMMOGRAM  09/09/2014     PMH Past Medical History:  Diagnosis Date  . Anemia   . Fibroids   . History of blood transfusion    Patient had a transfusion at birth [phone]  . Seasonal allergies   . SVD (spontaneous vaginal delivery)    x 2    ROS All review of systems negative except what is listed in the HPI  PHYSICAL EXAM General Appearance:  awake,  alert, oriented, in no acute distress and well developed, well nourished Skin:  skin color, texture, turgor are normal; there are no bruises, rashes or lesions. Head/face:  NCAT Eyes:  No gross abnormalities., PERRL, EOMI and Sclera nonicteric. MM moist  Ears:  Canals NL, serous effusion present bilaterally consistent with allergic effusion Nose/Sinuses:  Nares normal. Septum midline. Mucosa normal. No drainage or sinus tenderness. Mouth/Throat:  Mucosa moist, no lesions; pharynx without erythema, edema or exudate. Neck:  neck- supple, no mass, non-tender, no bruits and no jvd Back:  no pain to palpation, good flexion and extension, motor and sensory appear to be normal Lungs:  Normal expansion.  Clear to auscultation.  No rales, rhonchi, or wheezing., No chest wall tenderness. Heart:  Heart sounds are normal.  Regular rate and rhythm without murmur, gallop or rub. Abdomen:  Soft, non-tender, normal bowel sounds; no bruits, organomegaly or masses. Extremities: Extremities warm to touch, pink, with no edema. and no calf pain Musculoskeletal:  Range of motion normal in hips, knees, shoulders, and spine. Peripheral Pulses:  Capillary refill <2secs, strong peripheral pulses Neurologic:  Alert and oriented x 3, gait normal., reflexes normal and symmetric, strength and  sensation grossly normal Psych exam:alert,oriented, in NAD with a full range of affect, normal behavior and no psychotic features  ASSESSMENT AND PLAN Problem List Items Addressed This Visit    Encounter to establish care - Primary   Relevant Orders   CBC with Differential (Completed)   Comprehensive metabolic panel (Completed)   Lipid panel   Thyroid Panel With TSH   Hemoglobin A1c    Other Visit Diagnoses    Family history of diabetes mellitus in first degree relative       Relevant Orders   Hemoglobin A1c   Family history of thyroid disease       Relevant Orders   Thyroid Panel With TSH   Screening for endocrine,  nutritional, metabolic and immunity disorder       Relevant Orders   CBC with Differential (Completed)   Comprehensive metabolic panel (Completed)   Lipid panel   Screening for cholesterol level       Relevant Orders   Lipid panel   Screening for deficiency anemia       Relevant Orders   CBC with Differential (Completed)      Education provided today during visit and on AVS for patient to review at home.  Diet and Exercise recommendations provided.  Current diagnoses and recommendations discussed. HM recommendations reviewed with recommendations.    Outpatient Encounter Medications as of 07/28/2020  Medication Sig  . Cholecalciferol (VITAMIN D3) 125 MCG (5000 UT) CAPS Take 5,000 Units by mouth daily.  Marland Kitchen loratadine (CLARITIN) 10 MG tablet Take 10 mg by mouth daily as needed for allergies.  . Multiple Vitamin (MULTIVITAMIN WITH MINERALS) TABS tablet Take 1 tablet by mouth daily.  . Rhubarb (ESTROVEN COMPLETE) 4 MG TABS Take by mouth.  . vitamin B-12 (CYANOCOBALAMIN) 1000 MCG tablet Take 1,000 mcg by  mouth daily.  . [DISCONTINUED] guaifenesin (HUMIBID E) 400 MG TABS tablet Take 400 mg by mouth daily as needed (for congestion).  . [DISCONTINUED] ibuprofen (ADVIL,MOTRIN) 200 MG tablet Take 400 mg by mouth every 6 (six) hours as needed for headache or moderate pain.  . [DISCONTINUED] ibuprofen (ADVIL,MOTRIN) 600 MG tablet Take 1 tablet (600 mg total) by mouth every 6 (six) hours as needed for mild pain.  . [DISCONTINUED] oxyCODONE (OXY IR/ROXICODONE) 5 MG immediate release tablet Take 1 tablet (5 mg total) by mouth every 6 (six) hours as needed for moderate pain.   No facility-administered encounter medications on file as of 07/28/2020.    Return in about 1 year (around 07/28/2021) for CPE today and labs _ F/U in 1 year.  Time: 70 minutes, >50% spent counseling, care coordination, chart review, and documentation.   Orma Render, DNP, AGNP-c

## 2020-07-28 NOTE — Patient Instructions (Addendum)
Recommendations from today's visit: . The vaginal cream can be started at 0.5g inserted vaginally once a day for two weeks then used once a week for maintenance. If the 0.5g dose is not effective, you can increase to 1g dose to see if this helps.  . Nasocort, Rhinocort, or Flonase can be helpful for allergy symptoms.   Information on diet, exercise, and health maintenance recommendations are listed below. This is information to help you be sure you are on track for optimal health and monitoring.   Please look over this and let us know if you have any questions or if you have completed any of the health maintenance outside of [place] so that we can be sure your records are up to date.  ___________________________________________________________  Thank you for choosing [place] [facility] for your Primary Care needs. I am excited for the opportunity to partner with you to meet your health care goals. It was a pleasure meeting you today!  I am an Adult-Geriatric Nurse Practitioner with a background in caring for patients for more than 20 years. I received my Paediatric nurse in Nursing and my Doctor of Nursing Practice degrees at [employer]. I received additional fellowship training in primary care and sports medicine after receiving my doctorate degree. I provide primary care and sports medicine services to patients age 55 and older within this office. I am also a provider with the [facility] and the director of the APP Fellowship with [facility].  I am a [place] native, but have called the [place] area home for nearly 20 years and am proud to be a member of this community.   I am passionate about providing the best service to you through preventive medicine and supportive care. I consider you a part of the medical team and value your input. I work diligently to ensure that you are heard and your needs are met in a safe and  effective manner. I want you to feel comfortable with me as your provider and want you to know that your health concerns are important to me.   For your information, our office hours are Monday- Friday 8:00 AM - 5:00 PM At this time I am not in the office on Wednesdays.  If you have questions or concerns, please call our office at [phone] or send [place] a MyChart message and we will respond as quickly as possible.   For all urgent or time sensitive needs we ask that you please call the office to avoid delays. MyChart is not constantly monitored and replies may take up to 72 business hours.  MyChart Policy: . MyChart allows for you to see your visit notes, after visit summary, provider recommendations, lab and tests results, make an appointment, request refills, and contact your provider or the office for non-urgent questions or concerns.  . Providers are seeing patients during normal business hours and do not have built in time to review MyChart messages. We ask that you allow a minimum of 72 business hours for MyChart message responses.  . Complex MyChart concerns may require a visit. Your provider may request you schedule a virtual or in person visit to ensure we are providing the best care possible. . MyChart messages sent after 4:00 PM on Friday will not be received by the provider until Monday morning.    Lab and Test Results: . You will receive your lab and test results on MyChart as soon as  they are completed and results have been sent by the lab or testing facility. Due to this service, you will receive your results BEFORE your provider.  . Please allow a minimum of 72 business hours for your provider to receive and review lab and test results and contact you about.   . Most lab and test result comments from the provider will be sent through MyChart. Your provider may recommend changes to the plan of care, follow-up visits, repeat testing, ask questions, or request an office visit to  discuss these results. You may reply directly to this message or call the office at [phone] to provide information for the provider or set up an appointment. . In some instances, you will be called with test results and recommendations. Please let us know if this is preferred and we will make note of this in your chart to provide this for you.    . If you have not heard a response to your lab or test results in 72 business hours, please call the office to let us know.   After Hours: . For all non-emergency after hours needs, please call the office at [phone] and select the option to reach the on-call provider service. On-call services are shared between multiple [place] offices and therefore it will not be possible to speak directly with your provider. On-call providers may provide medical advice and recommendations, but are unable to provide refills for maintenance medications.  . For all emergency or urgent medical needs after normal business hours, we recommend that you seek care at the closest Urgent Care or Emergency Department to ensure appropriate treatment in a timely manner.  [facility] [place] has a 24 hour emergency room located on the ground floor for your convenience.    Please do not hesitate to reach out to [place] with concerns.   Thank you, again, for choosing me as your health care partner. I appreciate your trust and look forward to learning more about you.   Shawna Clamp, DNP, AGNP-c ___________________________________________________________  Health Maintenance Recommendations Screening Testing  Mammogram  Every 1 -2 years based on history and risk factors  Starting at age 14  Pap Smear  Ages 21-39 every 3 years  Ages 73-65 every 5 years with HPV testing  More frequent testing may be required based on results and history  Colon Cancer Screening  Every 1-10 years based on test performed, risk factors, and history  Starting at age  44  Bone Density Screening  Every 2-10 years based on history  Starting at age 63 for women  Recommendations for men differ based on medication usage, history, and risk factors  AAA Screening  One time ultrasound  Men 57-41 years old who have every smoked  Lung Cancer Screening  Low Dose Lung CT every 12 months  Age 68-80 years with a 30 pack-year smoking history who still smoke or who have quit within the last 15 years  Screening Labs  Routine  Labs: Complete Blood Count (CBC), Complete Metabolic Panel (CMP), Cholesterol (Lipid Panel)  Every 6-12 months based on history and medications  May be recommended more frequently based on current conditions or previous results  Hemoglobin A1c Lab  Every 3-12 months based on history and previous results  Starting at age 62 or earlier with diagnosis of diabetes, high cholesterol, BMI >26, and/or risk factors  Frequent monitoring for patients with diabetes to ensure blood sugar control  Thyroid Panel (TSH w/ T3 & T4)  Every 6 months based on history, symptoms, and risk factors  May be repeated more often if on medication  HIV  One time testing for all patients 69 and older  May be repeated more frequently for patients with increased risk factors or exposure  Hepatitis C  One time testing for all patients 62 and older  May be repeated more frequently for patients with increased risk factors or exposure  Gonorrhea, Chlamydia  Every 12 months for all sexually active persons 13-24 years  Additional monitoring may be recommended for those who are considered high risk or who have symptoms  PSA  Men 39-24 years old with risk factors  Additional screening may be recommended from age 34-69 based on risk factors, symptoms, and history  Vaccine Recommendations  Tetanus Booster  All adults every 10 years  Flu Vaccine  All patients 6 months and older every year  COVID Vaccine  All patients 12 years and  older  Initial dosing with booster  May recommend additional booster based on age and health history  HPV Vaccine  2 doses all patients age 82-26  Dosing may be considered for patients over 26  Shingles Vaccine (Shingrix)  2 doses all adults 42 years and older  Pneumonia (Pneumovax 23)  All adults 36 years and older  May recommend earlier dosing based on health history  Pneumonia (Prevnar 46)  All adults 34 years and older  Dosed 1 year after Pneumovax 23  Additional Screening, Testing, and Vaccinations may be recommended on an individualized basis based on family history, health history, risk factors, and/or exposure.  __________________________________________________________  Diet Recommendations for All Patients  I recommend that all patients maintain a diet low in saturated fats, carbohydrates, and cholesterol. While this can be challenging at first, it is not impossible and small changes can make big differences.  Things to try: Marland Kitchen Decreasing the amount of soda, sweet tea, and/or juice to one or less per day and replace with water o While water is always the first choice, if you do not like water you may consider - adding a water additive without sugar to improve the taste - other sugar free drinks . Replace potatoes with a brightly colored vegetable at dinner . Use healthy oils, such as canola oil or olive oil, instead of butter or hard margarine . Limit your bread intake to two pieces or less a day . Replace regular pasta with low carb pasta options . Bake, broil, or grill foods instead of frying . Monitor portion sizes  . Eat smaller, more frequent meals throughout the day instead of large meals  An important thing to remember is, if you love foods that are not great for your health, you don't have to give them up completely. Instead, allow these foods to be a reward when you have done well. Allowing yourself to still have special treats every once in a while is  a nice way to tell yourself thank you for working hard to keep yourself healthy.   Also remember that every day is a new day. If you have a bad day and "fall off the wagon", you can still climb right back up and keep moving along on your journey!  We have resources available to help you!  Some websites that may be helpful include: . www.http://carter.biz/  . Www.VeryWellFit.com _____________________________________________________________  Activity Recommendations for All Patients  I recommend that all adults get at least 20 minutes of moderate physical activity that elevates your heart rate at  least 5 days out of the week.  Some examples include: . Walking or jogging at a pace that allows you to carry on a conversation . Cycling (stationary bike or outdoors) . Water aerobics . Yoga . Weight lifting . Dancing If physical limitations prevent you from putting stress on your joints, exercise in a pool or seated in a chair are excellent options.  Do determine your MAXIMUM heart rate for activity: YOUR AGE - 220 = MAX HeartRate   Remember! . Do not push yourself too hard.  . Start slowly and build up your pace, speed, weight, time in exercise, etc.  . Allow your body to rest between exercise and get good sleep. . You will need more water than normal when you are exerting yourself. Do not wait until you are thirsty to drink. Drink with a purpose of getting in at least 8, 8 ounce glasses of water a day plus more depending on how much you exercise and sweat.    If you begin to develop dizziness, chest pain, abdominal pain, jaw pain, shortness of breath, headache, vision changes, lightheadedness, or other concerning symptoms, stop the activity and allow your body to rest. If your symptoms are severe, seek emergency evaluation immediately. If your symptoms are concerning, but not severe, please let us know so that we can recommend further evaluation.    ________________________________________________________________

## 2020-07-29 LAB — THYROID PANEL WITH TSH
Free Thyroxine Index: 1.5 (ref 1.2–4.9)
T3 Uptake Ratio: 30 % (ref 24–39)
T4, Total: 5.1 ug/dL (ref 4.5–12.0)
TSH: 0.073 u[IU]/mL — ABNORMAL LOW (ref 0.450–4.500)

## 2020-07-29 LAB — HEMOGLOBIN A1C
Hgb A1c MFr Bld: 5.4 % (ref 4.8–5.6)
Mean Plasma Glucose: 108 mg/dL

## 2020-08-01 NOTE — Progress Notes (Signed)
Please call the patient: TSH levels are very low, which indicates an overactive thyroid gland.  I recommend a referral to endocrinology for recommendations and further evaluation. OK to place order if patient agrees.   Hemoglobin A1c normal- no signs of diabetes.   Cholesterol elevated- recommend decrease saturated fats in diet (these are oils that are solid at room temperature) to see if we can get this down and decrease risks of cardiovascular disease.   All other labs normal.

## 2020-08-02 DIAGNOSIS — M9903 Segmental and somatic dysfunction of lumbar region: Secondary | ICD-10-CM | POA: Diagnosis not present

## 2020-08-02 DIAGNOSIS — M9904 Segmental and somatic dysfunction of sacral region: Secondary | ICD-10-CM | POA: Diagnosis not present

## 2020-08-02 DIAGNOSIS — M9902 Segmental and somatic dysfunction of thoracic region: Secondary | ICD-10-CM | POA: Diagnosis not present

## 2020-08-02 DIAGNOSIS — M722 Plantar fascial fibromatosis: Secondary | ICD-10-CM | POA: Diagnosis not present

## 2020-08-03 ENCOUNTER — Telehealth (HOSPITAL_BASED_OUTPATIENT_CLINIC_OR_DEPARTMENT_OTHER): Payer: Self-pay

## 2020-08-03 DIAGNOSIS — Z8349 Family history of other endocrine, nutritional and metabolic diseases: Secondary | ICD-10-CM

## 2020-08-03 NOTE — Telephone Encounter (Signed)
-----   Message from Orma Render, NP sent at 08/01/2020  5:52 PM EDT ----- Please call the patient: TSH levels are very low, which indicates an overactive thyroid gland.  I recommend a referral to endocrinology for recommendations and further evaluation. OK to place order if patient agrees.   Hemoglobin A1c normal- no signs of diabetes.   Cholesterol elevated- recommend decrease saturated fats in diet (these are oils that are solid at room temperature) to see if we can get this down and decrease risks of cardiovascular disease.   All other labs normal.

## 2020-08-03 NOTE — Telephone Encounter (Signed)
Called patient to discuss lab results and recommendations.  Could not leave a voicemail because the voicemail has not been set up.

## 2020-08-08 ENCOUNTER — Telehealth (HOSPITAL_BASED_OUTPATIENT_CLINIC_OR_DEPARTMENT_OTHER): Payer: Self-pay

## 2020-08-08 NOTE — Telephone Encounter (Signed)
-----   Message from Orma Render, NP sent at 08/01/2020  5:52 PM EDT ----- Please call the patient: TSH levels are very low, which indicates an overactive thyroid gland.  I recommend a referral to endocrinology for recommendations and further evaluation. OK to place order if patient agrees.   Hemoglobin A1c normal- no signs of diabetes.   Cholesterol elevated- recommend decrease saturated fats in diet (these are oils that are solid at room temperature) to see if we can get this down and decrease risks of cardiovascular disease.   All other labs normal.

## 2020-08-08 NOTE — Telephone Encounter (Signed)
Called patient to discuss lab results and recommendations.  She agreed with recommendations and endocrinology referral placed.  Instructed patient to contact the office with questions and concerns.

## 2020-08-16 DIAGNOSIS — M9902 Segmental and somatic dysfunction of thoracic region: Secondary | ICD-10-CM | POA: Diagnosis not present

## 2020-08-16 DIAGNOSIS — M9903 Segmental and somatic dysfunction of lumbar region: Secondary | ICD-10-CM | POA: Diagnosis not present

## 2020-08-16 DIAGNOSIS — M722 Plantar fascial fibromatosis: Secondary | ICD-10-CM | POA: Diagnosis not present

## 2020-08-16 DIAGNOSIS — M9904 Segmental and somatic dysfunction of sacral region: Secondary | ICD-10-CM | POA: Diagnosis not present

## 2020-09-05 ENCOUNTER — Encounter (HOSPITAL_BASED_OUTPATIENT_CLINIC_OR_DEPARTMENT_OTHER): Payer: Self-pay | Admitting: Nurse Practitioner

## 2020-09-06 ENCOUNTER — Other Ambulatory Visit (HOSPITAL_BASED_OUTPATIENT_CLINIC_OR_DEPARTMENT_OTHER): Payer: Self-pay | Admitting: Nurse Practitioner

## 2020-09-06 DIAGNOSIS — E0590 Thyrotoxicosis, unspecified without thyrotoxic crisis or storm: Secondary | ICD-10-CM

## 2020-09-08 ENCOUNTER — Encounter (HOSPITAL_BASED_OUTPATIENT_CLINIC_OR_DEPARTMENT_OTHER): Payer: Self-pay | Admitting: Nurse Practitioner

## 2020-09-13 DIAGNOSIS — M9903 Segmental and somatic dysfunction of lumbar region: Secondary | ICD-10-CM | POA: Diagnosis not present

## 2020-09-13 DIAGNOSIS — M9902 Segmental and somatic dysfunction of thoracic region: Secondary | ICD-10-CM | POA: Diagnosis not present

## 2020-09-13 DIAGNOSIS — M9904 Segmental and somatic dysfunction of sacral region: Secondary | ICD-10-CM | POA: Diagnosis not present

## 2020-09-13 DIAGNOSIS — M722 Plantar fascial fibromatosis: Secondary | ICD-10-CM | POA: Diagnosis not present

## 2020-09-14 ENCOUNTER — Encounter (HOSPITAL_BASED_OUTPATIENT_CLINIC_OR_DEPARTMENT_OTHER): Payer: Self-pay

## 2020-09-14 ENCOUNTER — Encounter (HOSPITAL_BASED_OUTPATIENT_CLINIC_OR_DEPARTMENT_OTHER): Payer: Self-pay | Admitting: Nurse Practitioner

## 2020-09-14 NOTE — Progress Notes (Signed)
Faxed demographic sheet, insurance card, office notes, and labs to [facility] (Dr. Gershon Mussel) for her appointment on 09/19/20 @ 2:30.

## 2020-09-19 DIAGNOSIS — E0590 Thyrotoxicosis, unspecified without thyrotoxic crisis or storm: Secondary | ICD-10-CM | POA: Diagnosis not present

## 2020-09-23 DIAGNOSIS — E0590 Thyrotoxicosis, unspecified without thyrotoxic crisis or storm: Secondary | ICD-10-CM | POA: Diagnosis not present

## 2020-10-19 DIAGNOSIS — M9904 Segmental and somatic dysfunction of sacral region: Secondary | ICD-10-CM | POA: Diagnosis not present

## 2020-10-19 DIAGNOSIS — M9903 Segmental and somatic dysfunction of lumbar region: Secondary | ICD-10-CM | POA: Diagnosis not present

## 2020-10-19 DIAGNOSIS — M9901 Segmental and somatic dysfunction of cervical region: Secondary | ICD-10-CM | POA: Diagnosis not present

## 2020-10-19 DIAGNOSIS — M9902 Segmental and somatic dysfunction of thoracic region: Secondary | ICD-10-CM | POA: Diagnosis not present

## 2020-10-20 DIAGNOSIS — Z1231 Encounter for screening mammogram for malignant neoplasm of breast: Secondary | ICD-10-CM | POA: Diagnosis not present

## 2020-10-20 DIAGNOSIS — Z13820 Encounter for screening for osteoporosis: Secondary | ICD-10-CM | POA: Diagnosis not present

## 2020-10-20 DIAGNOSIS — Z01419 Encounter for gynecological examination (general) (routine) without abnormal findings: Secondary | ICD-10-CM | POA: Diagnosis not present

## 2020-10-20 DIAGNOSIS — Z6830 Body mass index (BMI) 30.0-30.9, adult: Secondary | ICD-10-CM | POA: Diagnosis not present

## 2020-11-09 ENCOUNTER — Ambulatory Visit: Payer: BC Managed Care – PPO | Admitting: Internal Medicine

## 2020-11-11 DIAGNOSIS — E0789 Other specified disorders of thyroid: Secondary | ICD-10-CM | POA: Diagnosis not present

## 2020-11-11 DIAGNOSIS — E0590 Thyrotoxicosis, unspecified without thyrotoxic crisis or storm: Secondary | ICD-10-CM | POA: Diagnosis not present

## 2020-11-16 DIAGNOSIS — M9903 Segmental and somatic dysfunction of lumbar region: Secondary | ICD-10-CM | POA: Diagnosis not present

## 2020-11-16 DIAGNOSIS — M9901 Segmental and somatic dysfunction of cervical region: Secondary | ICD-10-CM | POA: Diagnosis not present

## 2020-11-16 DIAGNOSIS — M9904 Segmental and somatic dysfunction of sacral region: Secondary | ICD-10-CM | POA: Diagnosis not present

## 2020-11-16 DIAGNOSIS — M9902 Segmental and somatic dysfunction of thoracic region: Secondary | ICD-10-CM | POA: Diagnosis not present

## 2020-11-25 ENCOUNTER — Other Ambulatory Visit (HOSPITAL_BASED_OUTPATIENT_CLINIC_OR_DEPARTMENT_OTHER): Payer: Self-pay | Admitting: Nurse Practitioner

## 2020-12-12 DIAGNOSIS — E0590 Thyrotoxicosis, unspecified without thyrotoxic crisis or storm: Secondary | ICD-10-CM | POA: Diagnosis not present

## 2020-12-12 DIAGNOSIS — D440 Neoplasm of uncertain behavior of thyroid gland: Secondary | ICD-10-CM | POA: Diagnosis not present

## 2020-12-12 DIAGNOSIS — E041 Nontoxic single thyroid nodule: Secondary | ICD-10-CM | POA: Diagnosis not present

## 2021-01-26 DIAGNOSIS — M9904 Segmental and somatic dysfunction of sacral region: Secondary | ICD-10-CM | POA: Diagnosis not present

## 2021-01-26 DIAGNOSIS — M9903 Segmental and somatic dysfunction of lumbar region: Secondary | ICD-10-CM | POA: Diagnosis not present

## 2021-01-26 DIAGNOSIS — M9901 Segmental and somatic dysfunction of cervical region: Secondary | ICD-10-CM | POA: Diagnosis not present

## 2021-01-26 DIAGNOSIS — M9902 Segmental and somatic dysfunction of thoracic region: Secondary | ICD-10-CM | POA: Diagnosis not present

## 2021-02-09 DIAGNOSIS — M9904 Segmental and somatic dysfunction of sacral region: Secondary | ICD-10-CM | POA: Diagnosis not present

## 2021-02-09 DIAGNOSIS — M9902 Segmental and somatic dysfunction of thoracic region: Secondary | ICD-10-CM | POA: Diagnosis not present

## 2021-02-09 DIAGNOSIS — M9903 Segmental and somatic dysfunction of lumbar region: Secondary | ICD-10-CM | POA: Diagnosis not present

## 2021-02-09 DIAGNOSIS — M9901 Segmental and somatic dysfunction of cervical region: Secondary | ICD-10-CM | POA: Diagnosis not present

## 2021-03-07 ENCOUNTER — Encounter: Payer: Self-pay | Admitting: Nurse Practitioner

## 2021-03-07 DIAGNOSIS — C44719 Basal cell carcinoma of skin of left lower limb, including hip: Secondary | ICD-10-CM | POA: Diagnosis not present

## 2021-03-07 DIAGNOSIS — Z85828 Personal history of other malignant neoplasm of skin: Secondary | ICD-10-CM | POA: Diagnosis not present

## 2021-03-07 DIAGNOSIS — D1801 Hemangioma of skin and subcutaneous tissue: Secondary | ICD-10-CM | POA: Diagnosis not present

## 2021-03-07 DIAGNOSIS — C44712 Basal cell carcinoma of skin of right lower limb, including hip: Secondary | ICD-10-CM | POA: Diagnosis not present

## 2021-03-07 DIAGNOSIS — L814 Other melanin hyperpigmentation: Secondary | ICD-10-CM | POA: Diagnosis not present

## 2021-03-07 DIAGNOSIS — L821 Other seborrheic keratosis: Secondary | ICD-10-CM | POA: Diagnosis not present

## 2021-03-07 DIAGNOSIS — L570 Actinic keratosis: Secondary | ICD-10-CM | POA: Diagnosis not present

## 2021-03-13 DIAGNOSIS — M9904 Segmental and somatic dysfunction of sacral region: Secondary | ICD-10-CM | POA: Diagnosis not present

## 2021-03-13 DIAGNOSIS — M9901 Segmental and somatic dysfunction of cervical region: Secondary | ICD-10-CM | POA: Diagnosis not present

## 2021-03-13 DIAGNOSIS — M9903 Segmental and somatic dysfunction of lumbar region: Secondary | ICD-10-CM | POA: Diagnosis not present

## 2021-03-13 DIAGNOSIS — M9902 Segmental and somatic dysfunction of thoracic region: Secondary | ICD-10-CM | POA: Diagnosis not present

## 2021-03-16 ENCOUNTER — Other Ambulatory Visit (HOSPITAL_BASED_OUTPATIENT_CLINIC_OR_DEPARTMENT_OTHER): Payer: Self-pay | Admitting: Nurse Practitioner

## 2021-03-16 ENCOUNTER — Encounter (HOSPITAL_BASED_OUTPATIENT_CLINIC_OR_DEPARTMENT_OTHER): Payer: Self-pay | Admitting: Nurse Practitioner

## 2021-03-16 DIAGNOSIS — E0590 Thyrotoxicosis, unspecified without thyrotoxic crisis or storm: Secondary | ICD-10-CM

## 2021-03-16 NOTE — Progress Notes (Signed)
Amb ref endo

## 2021-04-03 DIAGNOSIS — C44719 Basal cell carcinoma of skin of left lower limb, including hip: Secondary | ICD-10-CM | POA: Diagnosis not present

## 2021-04-03 DIAGNOSIS — C44712 Basal cell carcinoma of skin of right lower limb, including hip: Secondary | ICD-10-CM | POA: Diagnosis not present

## 2021-04-04 DIAGNOSIS — M9902 Segmental and somatic dysfunction of thoracic region: Secondary | ICD-10-CM | POA: Diagnosis not present

## 2021-04-04 DIAGNOSIS — M9901 Segmental and somatic dysfunction of cervical region: Secondary | ICD-10-CM | POA: Diagnosis not present

## 2021-04-04 DIAGNOSIS — M9904 Segmental and somatic dysfunction of sacral region: Secondary | ICD-10-CM | POA: Diagnosis not present

## 2021-04-04 DIAGNOSIS — M9903 Segmental and somatic dysfunction of lumbar region: Secondary | ICD-10-CM | POA: Diagnosis not present

## 2021-05-12 DIAGNOSIS — M9901 Segmental and somatic dysfunction of cervical region: Secondary | ICD-10-CM | POA: Diagnosis not present

## 2021-05-12 DIAGNOSIS — M9902 Segmental and somatic dysfunction of thoracic region: Secondary | ICD-10-CM | POA: Diagnosis not present

## 2021-05-12 DIAGNOSIS — M9904 Segmental and somatic dysfunction of sacral region: Secondary | ICD-10-CM | POA: Diagnosis not present

## 2021-05-12 DIAGNOSIS — M9903 Segmental and somatic dysfunction of lumbar region: Secondary | ICD-10-CM | POA: Diagnosis not present

## 2021-05-31 ENCOUNTER — Encounter (HOSPITAL_BASED_OUTPATIENT_CLINIC_OR_DEPARTMENT_OTHER): Payer: Self-pay | Admitting: Nurse Practitioner

## 2021-06-06 DIAGNOSIS — M9901 Segmental and somatic dysfunction of cervical region: Secondary | ICD-10-CM | POA: Diagnosis not present

## 2021-06-06 DIAGNOSIS — M9903 Segmental and somatic dysfunction of lumbar region: Secondary | ICD-10-CM | POA: Diagnosis not present

## 2021-06-06 DIAGNOSIS — M9902 Segmental and somatic dysfunction of thoracic region: Secondary | ICD-10-CM | POA: Diagnosis not present

## 2021-06-06 DIAGNOSIS — M9904 Segmental and somatic dysfunction of sacral region: Secondary | ICD-10-CM | POA: Diagnosis not present

## 2021-06-26 DIAGNOSIS — H00025 Hordeolum internum left lower eyelid: Secondary | ICD-10-CM | POA: Diagnosis not present

## 2021-07-31 ENCOUNTER — Encounter (HOSPITAL_BASED_OUTPATIENT_CLINIC_OR_DEPARTMENT_OTHER): Payer: Self-pay | Admitting: Nurse Practitioner

## 2021-07-31 ENCOUNTER — Ambulatory Visit (INDEPENDENT_AMBULATORY_CARE_PROVIDER_SITE_OTHER): Payer: BC Managed Care – PPO | Admitting: Nurse Practitioner

## 2021-07-31 VITALS — BP 125/94 | HR 68 | Temp 97.8°F | Ht 66.5 in | Wt 196.6 lb

## 2021-07-31 DIAGNOSIS — R5382 Chronic fatigue, unspecified: Secondary | ICD-10-CM | POA: Diagnosis not present

## 2021-07-31 DIAGNOSIS — Z8616 Personal history of COVID-19: Secondary | ICD-10-CM

## 2021-07-31 DIAGNOSIS — Z0000 Encounter for general adult medical examination without abnormal findings: Secondary | ICD-10-CM | POA: Diagnosis not present

## 2021-07-31 DIAGNOSIS — Z0001 Encounter for general adult medical examination with abnormal findings: Secondary | ICD-10-CM

## 2021-07-31 DIAGNOSIS — R053 Chronic cough: Secondary | ICD-10-CM

## 2021-07-31 DIAGNOSIS — M778 Other enthesopathies, not elsewhere classified: Secondary | ICD-10-CM | POA: Diagnosis not present

## 2021-07-31 NOTE — Progress Notes (Signed)
BP (!) 125/94   Pulse 68   Temp 97.8 F (36.6 C) (Oral)   Ht 5' 6.5" (1.689 m)   Wt 196 lb 9.6 oz (89.2 kg)   LMP  (LMP Unknown)   SpO2 97%   BMI 31.26 kg/m  - post menopausal  Subjective:    Patient ID: Elizabeth Carpenter, female    DOB: [birth year], 61 y.o.   MRN: [id number]  HPI: Elizabeth Carpenter is a 61 y.o. female presenting on 07/31/2021 for comprehensive medical examination.   Current medical concerns include: fatigue and cough Not feeling herself lately and feels very tired. She doesn't ever really feel refreshed. Started after having COVID last year. She feels like things are getting better, but not quite back to normal.  Her husband recently lost his job and she is very stressed about this.   Cough- following COVID has gotten a lot worse Anytime she does get sick she feels like it goes to the chest She endorses increased mucous production and cough on daily basis. No evaluation for this in the past.   Left wrist pain Pain present for several weeks Pain with pronation Weakness  She has had a mammogram in the last- [facility] She is getting close for her colonoscopy, but not due yet  She reports regular vision exams q1-5y: yes She reports regular dental exams q 75m yes Her diet consists of:  overall healthy options She endorses exercise and/or activity of:  she has been going to the gym and is working on walking and pickle ball She works in:  [employer] She endorses ETOH use ( socially ) She denies nictoine use  She denies illegal substance use   She is post menopausal and has had a total hysterectomy.  She is currently sexually active with spouse only She denies concerns today about STI  She denies concerns about skin changes today  She denies concerns about bowel changes today  She denies concerns about bladder changes today   Most Recent Depression Screen:     07/31/2021    9:18 AM 07/28/2020    8:53 AM  Depression screen PHQ 2/9  Decreased  Interest 1 0  Down, Depressed, Hopeless 1 0  PHQ - 2 Score 2 0  Altered sleeping 1 1  Tired, decreased energy 1 1  Change in appetite 1 0  Feeling bad or failure about yourself  1 0  Trouble concentrating 1 0  Moving slowly or fidgety/restless 1 0  Suicidal thoughts 0 0  PHQ-9 Score 8 2  Difficult doing work/chores Somewhat difficult Not difficult at all   Most Recent Anxiety Screen:     07/28/2020    8:53 AM  GAD 7 : Generalized Anxiety Score  Nervous, Anxious, on Edge 0  Control/stop worrying 0  Worry too much - different things 0  Trouble relaxing 0  Restless 0  Easily annoyed or irritable 0  Afraid - awful might happen 0  Total GAD 7 Score 0   Most Recent Fall Screen:    07/31/2021    9:18 AM 07/28/2020    8:53 AM  Fall Risk   Falls in the past year? 0 0  Number falls in past yr: 0 0  Injury with Fall? 0 0  Risk for fall due to : No Fall Risks No Fall Risks  Follow up Falls evaluation completed Falls evaluation completed    All ROS negative except what is listed above and in the HPI.   Past  medical history, surgical history, medications, allergies, family history and social history reviewed with patient today and changes made to appropriate areas of the chart.  Past Medical History:  Past Medical History:  Diagnosis Date   Anemia    Fibroids    History of blood transfusion    Patient had a transfusion at birth [phone]   Seasonal allergies    SVD (spontaneous vaginal delivery)    x 2   Medications:  Current Outpatient Medications on File Prior to Visit  Medication Sig   Calcium Carbonate 500 MG CHEW Chew by mouth.   conjugated estrogens (PREMARIN) vaginal cream Premarin 0.625 mg/gram vaginal cream  Insert 0.5 applicatorsful twice a week by vaginal route.   D 1000 25 MCG (1000 UT) capsule SMARTSIG:1 By Mouth   fluticasone (FLONASE) 50 MCG/ACT nasal spray Place into the nose.   imiquimod (ALDARA) 5 % cream Apply topically.   loratadine (CLARITIN) 10 MG tablet  Take 10 mg by mouth daily as needed for allergies.   Multiple Vitamin (MULTIVITAMIN WITH MINERALS) TABS tablet Take 1 tablet by mouth daily.   Rhubarb (ESTROVEN COMPLETE) 4 MG TABS Take by mouth.   vitamin B-12 (CYANOCOBALAMIN) 1000 MCG tablet Take 1,000 mcg by mouth daily.   No current facility-administered medications on file prior to visit.   Surgical History:  Past Surgical History:  Procedure Laterality Date   BREAST SURGERY  2006   reduction   COLONOSCOPY     DILATION AND CURETTAGE OF UTERUS     HYSTERECTOMY ABDOMINAL WITH SALPINGO-OOPHORECTOMY Bilateral 02/27/2018   Procedure: possible HYSTERECTOMY ABDOMINAL WITH SALPINGO-OOPHORECTOMY;  Surgeon: Louretta Shorten, MD;  Location: [facility];  Service: Gynecology;  Laterality: Bilateral;   LAPAROSCOPIC VAGINAL HYSTERECTOMY WITH SALPINGO OOPHORECTOMY Bilateral 02/27/2018   Procedure: LAPAROSCOPIC ASSISTED VAGINAL HYSTERECTOMY WITH SALPINGO OOPHORECTOMY;  Surgeon: Louretta Shorten, MD;  Location: [facility];  Service: Gynecology;  Laterality: Bilateral;   UPPER GI ENDOSCOPY     WISDOM TOOTH EXTRACTION     Allergies:  Allergies  Allergen Reactions   Morphine And Related Nausea And Vomiting   Amoxicillin Rash   Social History:  Social History   Socioeconomic History   Marital status: Married    Spouse name: Not on file   Number of children: 2   Years of education: Not on file   Highest education level: Not on file  Occupational History   Not on file  Tobacco Use   Smoking status: Never   Smokeless tobacco: Never  Vaping Use   Vaping Use: Never used  Substance and Sexual Activity   Alcohol use: Yes    Alcohol/week: 3.0 - 5.0 standard drinks    Types: 3 - 5 Glasses of wine per week   Drug use: Never   Sexual activity: Yes    Birth control/protection: Post-menopausal  Other Topics Concern   Not on file  Social History Narrative   Not on file   Social Determinants of Health   Financial Resource Strain: Not on file  Food Insecurity:  Not on file  Transportation Needs: Not on file  Physical Activity: Not on file  Stress: Not on file  Social Connections: Not on file  Intimate Partner Violence: Not on file   Social History   Tobacco Use  Smoking Status Never  Smokeless Tobacco Never   Social History   Substance and Sexual Activity  Alcohol Use Yes   Alcohol/week: 3.0 - 5.0 standard drinks   Types: 3 - 5 Glasses of wine per week  Family History:  History reviewed. No pertinent family history.     Objective:    BP (!) 125/94   Pulse 68   Temp 97.8 F (36.6 C) (Oral)   Ht 5' 6.5" (1.689 m)   Wt 196 lb 9.6 oz (89.2 kg)   LMP  (LMP Unknown)   SpO2 97%   BMI 31.26 kg/m   Wt Readings from Last 3 Encounters:  07/31/21 196 lb 9.6 oz (89.2 kg)  07/28/20 191 lb 6.4 oz (86.8 kg)  02/27/18 192 lb 0.3 oz (87.1 kg)    Physical Exam Vitals and nursing note reviewed.  Constitutional:      General: She is not in acute distress.    Appearance: Normal appearance.  HENT:     Head: Normocephalic and atraumatic.     Right Ear: Hearing, tympanic membrane, ear canal and external ear normal.     Left Ear: Hearing, tympanic membrane, ear canal and external ear normal.     Nose: Nose normal.     Right Sinus: No maxillary sinus tenderness or frontal sinus tenderness.     Left Sinus: No maxillary sinus tenderness or frontal sinus tenderness.     Mouth/Throat:     Lips: Pink.     Mouth: Mucous membranes are moist.     Pharynx: Oropharynx is clear.  Eyes:     General: Lids are normal. Vision grossly intact.     Extraocular Movements: Extraocular movements intact.     Conjunctiva/sclera: Conjunctivae normal.     Pupils: Pupils are equal, round, and reactive to light.     Funduscopic exam:    Right eye: Red reflex present.        Left eye: Red reflex present.    Visual Fields: Right eye visual fields normal and left eye visual fields normal.  Neck:     Thyroid: No thyromegaly.     Vascular: No carotid bruit.   Cardiovascular:     Rate and Rhythm: Normal rate and regular rhythm.     Chest Wall: PMI is not displaced.     Pulses: Normal pulses.          Dorsalis pedis pulses are 2+ on the right side and 2+ on the left side.       Posterior tibial pulses are 2+ on the right side and 2+ on the left side.     Heart sounds: Normal heart sounds. No murmur heard. Pulmonary:     Effort: Pulmonary effort is normal. No respiratory distress.     Breath sounds: Normal breath sounds.  Abdominal:     General: Abdomen is flat. Bowel sounds are normal. There is no distension.     Palpations: Abdomen is soft. There is no hepatomegaly, splenomegaly or mass.     Tenderness: There is no abdominal tenderness. There is no right CVA tenderness, left CVA tenderness, guarding or rebound.  Musculoskeletal:        General: Tenderness present.       Arms:     Cervical back: Full passive range of motion without pain, normal range of motion and neck supple. No tenderness.     Right lower leg: No edema.     Left lower leg: No edema.     Comments: Tenderness on palpation and with use to the extensor carpi ulnaris tendon. Strength intact. No edema noted.   Feet:     Left foot:     Toenail Condition: Left toenails are normal.  Lymphadenopathy:  Cervical: No cervical adenopathy.     Upper Body:     Right upper body: No supraclavicular adenopathy.     Left upper body: No supraclavicular adenopathy.  Skin:    General: Skin is warm and dry.     Capillary Refill: Capillary refill takes less than 2 seconds.     Nails: There is no clubbing.  Neurological:     General: No focal deficit present.     Mental Status: She is alert and oriented to person, place, and time.     GCS: GCS eye subscore is 4. GCS verbal subscore is 5. GCS motor subscore is 6.     Sensory: Sensation is intact.     Motor: Motor function is intact.     Coordination: Coordination is intact.     Gait: Gait is intact.     Deep Tendon Reflexes: Reflexes  are normal and symmetric.  Psychiatric:        Attention and Perception: Attention normal.        Mood and Affect: Mood normal.        Speech: Speech normal.        Behavior: Behavior normal. Behavior is cooperative.        Thought Content: Thought content normal.        Cognition and Memory: Cognition and memory normal.        Judgment: Judgment normal.    Results for orders placed or performed in visit on 09/08/20  HM HIV SCREENING LAB  Result Value Ref Range   HM HIV Screening Negative - Validated   Basic metabolic panel  Result Value Ref Range   Glucose 112    BUN 24 (A) 4 - 21   Creatinine 0.9 0.5 - 1.1  Comprehensive metabolic panel  Result Value Ref Range   GFR calc non Af Amer 70   TSH  Result Value Ref Range   TSH 0.57 0.41 - 5.90      Assessment & Plan:   Problem List Items Addressed This Visit     Chronic cough    Cough with increased mucous production since having COVID. Last infection of COVID in the fall 2022.  She has not had any evaluation for this, but is concerned with the ongoing symptoms. No weight loss, fevers, shortness of breath, CP, or decreased o2 saturation.  Will send referral to pulmonology for complete evaluation. She may benefit from PFT's to evaluate for possible chronic respiratory concern.        Relevant Orders   Ambulatory referral to Pulmonology   Extensor carpi ulnaris tendinitis    Symptoms and presentation consistent with extensor carpi tendonitis. No alarm sx present at this time.  Recommend at home stretches and exercises, as well as rest to the wrist.  If no improvement with conservative measures, will send referral to ortho.        Chronic fatigue    Improving with time since last COVID infection. Still not to baseline. Labs have been unremarkable for cause. I do feel that this is related to long COVID and the ongoing process of recovery. We will continue to monitor.        Encounter for annual physical exam - Primary    Other Visit Diagnoses     Healthcare maintenance       Relevant Orders   Hemoglobin A1c   VITAMIN D 25 Hydroxy (Vit-D Deficiency, Fractures)   TSH   Lipid panel   Comprehensive metabolic panel  CBC with Differential/Platelet   Hepatitis C antibody         IMMUNIZATIONS:   - Tdap: Tetanus vaccination status reviewed: last tetanus booster within 10 years. - Influenza: Postponed to flu season - Pneumovax: Not applicable - Prevnar: Not applicable - HPV: Not applicable - Zostavax vaccine: Up to date  SCREENING: - Pap smear: done elsewhere - STI testing: done elsewhere -Mammogram: Done elsewhere  - Colonoscopy: Up to date  - Bone Density: Not applicable  -Hearing Test: Not applicable  -Spirometry: Not applicable   Follow up plan: Return in about 1 year (around 08/01/2022) for CPE with labs.  NEXT PREVENTATIVE PHYSICAL DUE IN 1 YEAR.  PATIENT COUNSELING PROVIDED:   For all adult patients, I recommend   A well balanced diet low in saturated fats, cholesterol, and moderation in carbohydrates.   This can be as simple as monitoring portion sizes and cutting back on sugary beverages such as soda and juice to start with.    Daily water consumption of at least 64 ounces.  Physical activity at least 180 minutes per week, if just starting out.   This can be as simple as taking the stairs instead of the elevator and walking 2-3 laps around the office  purposefully every day.   STD protection, partner selection, and regular testing if high risk.  Limited consumption of alcoholic beverages if alcohol is consumed.  For women, I recommend no more than 7 alcoholic beverages per week, spread out throughout the week.  Avoid "binge" drinking or consuming large quantities of alcohol in one setting.   Please let me know if you feel you may need help with reduction or quitting alcohol consumption.   Avoidance of nicotine, if used.  Please let me know if you feel you may need help with  reduction or quitting nicotine use.   Daily mental health attention.  This can be in the form of 5 minute daily meditation, prayer, journaling, yoga, reflection, etc.   Purposeful attention to your emotions and mental state can significantly improve your overall wellbeing and Health.  Please know that I am here to help you with all of your health care goals and am happy to work with you to find a solution that works best for you.  The greatest advice I have received with any changes in life are to take it one step at a time, that even means if all you can focus on is the next 60 seconds, then do that and celebrate your victories.  With any changes in life, you will have set backs, and that is OK. The important thing to remember is, if you have a set back, it is not a failure, it is an opportunity to try again!  Health Maintenance Recommendations Screening Testing Mammogram Every 1 -2 years based on history and risk factors Starting at age 44 Pap Smear Ages 21-39 every 3 years Ages 46-65 every 5 years with HPV testing More frequent testing may be required based on results and history Colon Cancer Screening Every 1-10 years based on test performed, risk factors, and history Starting at age 67 Bone Density Screening Every 2-10 years based on history Starting at age 54 for women Recommendations for men differ based on medication usage, history, and risk factors AAA Screening One time ultrasound Men 75-53 years old who have every smoked Lung Cancer Screening Low Dose Lung CT every 12 months Age 28-80 years with a 30 pack-year smoking history who still smoke or who have  quit within the last 15 years  Screening Labs Routine  Labs: Complete Blood Count (CBC), Complete Metabolic Panel (CMP), Cholesterol (Lipid Panel) Every 6-12 months based on history and medications May be recommended more frequently based on current conditions or previous results Hemoglobin A1c Lab Every 3-12 months  based on history and previous results Starting at age 20 or earlier with diagnosis of diabetes, high cholesterol, BMI >26, and/or risk factors Frequent monitoring for patients with diabetes to ensure blood sugar control Thyroid Panel (TSH w/ T3 & T4) Every 6 months based on history, symptoms, and risk factors May be repeated more often if on medication HIV One time testing for all patients 13 and older May be repeated more frequently for patients with increased risk factors or exposure Hepatitis C One time testing for all patients 7 and older May be repeated more frequently for patients with increased risk factors or exposure Gonorrhea, Chlamydia Every 12 months for all sexually active persons 13-24 years Additional monitoring may be recommended for those who are considered high risk or who have symptoms PSA Men 49-68 years old with risk factors Additional screening may be recommended from age 56-69 based on risk factors, symptoms, and history  Vaccine Recommendations Tetanus Booster All adults every 10 years Flu Vaccine All patients 6 months and older every year COVID Vaccine All patients 12 years and older Initial dosing with booster May recommend additional booster based on age and health history HPV Vaccine 2 doses all patients age 51-26 Dosing may be considered for patients over 26 Shingles Vaccine (Shingrix) 2 doses all adults 45 years and older Pneumonia (Pneumovax 23) All adults 2 years and older May recommend earlier dosing based on health history Pneumonia (Prevnar 25) All adults 35 years and older Dosed 1 year after Pneumovax 23  Additional Screening, Testing, and Vaccinations may be recommended on an individualized basis based on family history, health history, risk factors, and/or exposure.

## 2021-07-31 NOTE — Assessment & Plan Note (Signed)
Symptoms and presentation consistent with extensor carpi tendonitis. No alarm sx present at this time.  Recommend at home stretches and exercises, as well as rest to the wrist.  If no improvement with conservative measures, will send referral to ortho.

## 2021-07-31 NOTE — Assessment & Plan Note (Signed)
Cough with increased mucous production since having COVID. Last infection of COVID in the fall 2022.  She has not had any evaluation for this, but is concerned with the ongoing symptoms. No weight loss, fevers, shortness of breath, CP, or decreased o2 saturation.  Will send referral to pulmonology for complete evaluation. She may benefit from PFT's to evaluate for possible chronic respiratory concern.

## 2021-07-31 NOTE — Assessment & Plan Note (Signed)
Improving with time since last COVID infection. Still not to baseline. Labs have been unremarkable for cause. I do feel that this is related to long COVID and the ongoing process of recovery. We will continue to monitor.

## 2021-07-31 NOTE — Patient Instructions (Signed)
It was a pleasure seeing you today. I hope your time spent with [place] was pleasant and helpful. Please let us know if there is anything we can do to improve the service you receive.   I have put exercises on here for the tendon and muscle in your arm. If this is not any better in two weeks, send me a message and we can get you in with orthopedics.   Important Office Information Lab Results If labs were ordered, please note that you will see results through [facility] as soon as they come available from [place].  It takes up to 5 business days for the results to be routed to me and for me to review them once all of the lab results have come through from [facility]. I will make recommendations based on your results and send these through [place] or someone from the office will call you to discuss. If your labs are abnormal, we may contact you to schedule a visit to discuss the results and make recommendations.  If you have not heard from [place] within 5 business days or you have waited longer than a week and your lab results have not come through on [place], please feel free to call the office or send a message through [place] to follow-up on these labs.   Referrals If referrals were placed today, the office where the referral was sent will contact you either by phone or through [place] to set up scheduling. Please note that it can take up to a week for the referral office to contact you. If you do not hear from them in a week, please contact the referral office directly to inquire about scheduling.   Condition Treated If your condition worsens or you begin to have new symptoms, please schedule a follow-up appointment for further evaluation. If you are not sure if an appointment is needed, you may call the office to leave a message for the nurse and someone will contact you with recommendations.  If you have an urgent or life threatening emergency, please do not call the office, but seek emergency evaluation by  calling 911 or going to the nearest emergency room for evaluation.   MyChart and Phone Calls Please do not use MyChart for urgent messages. It may take up to 3 business days for MyChart messages to be read by staff and if they are unable to handle the request, an additional 3 business days for them to be routed to me and for my response.  Messages sent to the provider through [place] do not come directly to the provider, please allow time for these messages to be routed and for me to respond.  We get a large volume of MyChart messages daily and these are responded to in the order received.   For urgent messages, please call the office at [phone] and speak with the front office staff or leave a message on the line of my assistant for guidance.  We are seeing patients from the hours of 8:00 am through 5:00 pm and calls directly to the nurse may not be answered immediately due to seeing patients, but your call will be returned as soon as possible.  Phone  messages received after 4:00 PM Monday through Thursday may not be returned until the following business day. Phone messages received after 11:00 AM on Friday may not be returned until Monday.   After Hours We share on call hours with providers from other offices. If you have an urgent need  after hours that cannot wait until the next business day, please contact the on call provider by calling the office number. A nurse will speak with you and contact the provider if needed for recommendations.  If you have an urgent or life threatening emergency after hours, please do not call the on call provider, but seek emergency evaluation by calling 911 or going to the nearest emergency room for evaluation.   Paperwork All paperwork requires a minimum of 5 days to complete and return to you or the designated personnel. Please keep this in mind when bringing in forms or sending requests for paperwork completion to the office.

## 2021-08-01 LAB — COMPREHENSIVE METABOLIC PANEL
ALT: 31 IU/L (ref 0–32)
AST: 20 IU/L (ref 0–40)
Albumin/Globulin Ratio: 2.1 (ref 1.2–2.2)
Albumin: 4.7 g/dL (ref 3.8–4.9)
Alkaline Phosphatase: 50 IU/L (ref 44–121)
BUN/Creatinine Ratio: 18 (ref 9–23)
BUN: 16 mg/dL (ref 6–24)
Bilirubin Total: 0.5 mg/dL (ref 0.0–1.2)
CO2: 23 mmol/L (ref 20–29)
Calcium: 9.7 mg/dL (ref 8.7–10.2)
Chloride: 104 mmol/L (ref 96–106)
Creatinine, Ser: 0.87 mg/dL (ref 0.57–1.00)
Globulin, Total: 2.2 g/dL (ref 1.5–4.5)
Glucose: 102 mg/dL — ABNORMAL HIGH (ref 70–99)
Potassium: 4.1 mmol/L (ref 3.5–5.2)
Sodium: 141 mmol/L (ref 134–144)
Total Protein: 6.9 g/dL (ref 6.0–8.5)
eGFR: 77 mL/min/{1.73_m2} (ref >59)

## 2021-08-01 LAB — CBC WITH DIFFERENTIAL/PLATELET
Basophils Absolute: 0 10*3/uL (ref 0.0–0.2)
Basos: 0 %
EOS (ABSOLUTE): 0.1 10*3/uL (ref 0.0–0.4)
Eos: 2 %
Hematocrit: 41.9 % (ref 34.0–46.6)
Hemoglobin: 14.1 g/dL (ref 11.1–15.9)
Immature Grans (Abs): 0 10*3/uL (ref 0.0–0.1)
Immature Granulocytes: 0 %
Lymphocytes Absolute: 1.9 10*3/uL (ref 0.7–3.1)
Lymphs: 38 %
MCH: 29.8 pg (ref 26.6–33.0)
MCHC: 33.7 g/dL (ref 31.5–35.7)
MCV: 89 fL (ref 79–97)
Monocytes Absolute: 0.5 10*3/uL (ref 0.1–0.9)
Monocytes: 9 %
Neutrophils Absolute: 2.5 10*3/uL (ref 1.4–7.0)
Neutrophils: 51 %
Platelets: 203 10*3/uL (ref 150–450)
RBC: 4.73 x10E6/uL (ref 3.77–5.28)
RDW: 12.9 % (ref 11.7–15.4)
WBC: 4.9 10*3/uL (ref 3.4–10.8)

## 2021-08-01 LAB — LIPID PANEL
Chol/HDL Ratio: 4.3 ratio (ref 0.0–4.4)
Cholesterol, Total: 255 mg/dL — ABNORMAL HIGH (ref 100–199)
HDL: 59 mg/dL (ref >39)
LDL Chol Calc (NIH): 169 mg/dL — ABNORMAL HIGH (ref 0–99)
Triglycerides: 151 mg/dL — ABNORMAL HIGH (ref 0–149)
VLDL Cholesterol Cal: 27 mg/dL (ref 5–40)

## 2021-08-01 LAB — TSH: TSH: 0.178 u[IU]/mL — ABNORMAL LOW (ref 0.450–4.500)

## 2021-08-01 LAB — VITAMIN D 25 HYDROXY (VIT D DEFICIENCY, FRACTURES): Vit D, 25-Hydroxy: 59.3 ng/mL (ref 30.0–100.0)

## 2021-08-01 LAB — HEPATITIS C ANTIBODY: Hep C Virus Ab: NONREACTIVE

## 2021-08-01 LAB — HEMOGLOBIN A1C
Est. average glucose Bld gHb Est-mCnc: 114 mg/dL
Hgb A1c MFr Bld: 5.6 % (ref 4.8–5.6)

## 2021-08-03 ENCOUNTER — Encounter (HOSPITAL_BASED_OUTPATIENT_CLINIC_OR_DEPARTMENT_OTHER): Payer: Self-pay | Admitting: Nurse Practitioner

## 2021-08-03 ENCOUNTER — Other Ambulatory Visit (HOSPITAL_BASED_OUTPATIENT_CLINIC_OR_DEPARTMENT_OTHER): Payer: Self-pay

## 2021-08-03 DIAGNOSIS — Z0000 Encounter for general adult medical examination without abnormal findings: Secondary | ICD-10-CM

## 2021-09-01 NOTE — Progress Notes (Signed)
Synopsis: Referred for chronic cough by Early, Coralee Pesa, NP  Subjective:   PATIENT ID: Elizabeth Carpenter GENDER: female DOB: 1961/01/15, MRN: [id number]  No chief complaint on file.  61yF with history of anemia, fibroids, seasonal allergy, never smoker, thyroid nodules referred for chronic cough.  Otherwise pertinent review of systems is negative.  Past Medical History:  Diagnosis Date   Anemia    Fibroids    History of blood transfusion    Patient had a transfusion at birth [phone]   Seasonal allergies    SVD (spontaneous vaginal delivery)    x 2     No family history on file.   Past Surgical History:  Procedure Laterality Date   BREAST SURGERY  2006   reduction   COLONOSCOPY     DILATION AND CURETTAGE OF UTERUS     HYSTERECTOMY ABDOMINAL WITH SALPINGO-OOPHORECTOMY Bilateral 02/27/2018   Procedure: possible HYSTERECTOMY ABDOMINAL WITH SALPINGO-OOPHORECTOMY;  Surgeon: Louretta Shorten, MD;  Location: [facility];  Service: Gynecology;  Laterality: Bilateral;   LAPAROSCOPIC VAGINAL HYSTERECTOMY WITH SALPINGO OOPHORECTOMY Bilateral 02/27/2018   Procedure: LAPAROSCOPIC ASSISTED VAGINAL HYSTERECTOMY WITH SALPINGO OOPHORECTOMY;  Surgeon: Louretta Shorten, MD;  Location: [facility];  Service: Gynecology;  Laterality: Bilateral;   UPPER GI ENDOSCOPY     WISDOM TOOTH EXTRACTION      Social History   Socioeconomic History   Marital status: Married    Spouse name: Not on file   Number of children: 2   Years of education: Not on file   Highest education level: Not on file  Occupational History   Not on file  Tobacco Use   Smoking status: Never   Smokeless tobacco: Never  Vaping Use   Vaping Use: Never used  Substance and Sexual Activity   Alcohol use: Yes    Alcohol/week: 3.0 - 5.0 standard drinks of alcohol    Types: 3 - 5 Glasses of wine per week   Drug use: Never   Sexual activity: Yes    Birth control/protection: Post-menopausal  Other Topics Concern   Not on file  Social History  Narrative   Not on file   Social Determinants of Health   Financial Resource Strain: Not on file  Food Insecurity: Not on file  Transportation Needs: Not on file  Physical Activity: Not on file  Stress: Not on file  Social Connections: Not on file  Intimate Partner Violence: Not on file     Allergies  Allergen Reactions   Morphine And Related Nausea And Vomiting   Amoxicillin Rash     Outpatient Medications Prior to Visit  Medication Sig Dispense Refill   Calcium Carbonate 500 MG CHEW Chew by mouth.     conjugated estrogens (PREMARIN) vaginal cream Premarin 0.625 mg/gram vaginal cream  Insert 0.5 applicatorsful twice a week by vaginal route.     D 1000 25 MCG (1000 UT) capsule SMARTSIG:1 By Mouth     fluticasone (FLONASE) 50 MCG/ACT nasal spray Place into the nose.     imiquimod (ALDARA) 5 % cream Apply topically.     loratadine (CLARITIN) 10 MG tablet Take 10 mg by mouth daily as needed for allergies.     Multiple Vitamin (MULTIVITAMIN WITH MINERALS) TABS tablet Take 1 tablet by mouth daily.     Rhubarb (ESTROVEN COMPLETE) 4 MG TABS Take by mouth.     vitamin B-12 (CYANOCOBALAMIN) 1000 MCG tablet Take 1,000 mcg by mouth daily.     No facility-administered medications prior to visit.  Objective:   Physical Exam:  General appearance: 61 y.o., female, NAD, conversant  Eyes: anicteric sclerae; PERRL, tracking appropriately HENT: NCAT; MMM Neck: Trachea midline; no lymphadenopathy, no JVD Lungs: CTAB, no crackles, no wheeze, with normal respiratory effort CV: RRR, no murmur  Abdomen: Soft, non-tender; non-distended, BS present  Extremities: No peripheral edema, warm Skin: Normal turgor and texture; no rash Psych: Appropriate affect Neuro: Alert and oriented to person and place, no focal deficit     There were no vitals filed for this visit.   on *** LPM *** RA BMI Readings from Last 3 Encounters:  07/31/21 31.26 kg/m  07/28/20 29.98 kg/m  02/27/18  30.53 kg/m   Wt Readings from Last 3 Encounters:  07/31/21 196 lb 9.6 oz (89.2 kg)  07/28/20 191 lb 6.4 oz (86.8 kg)  02/27/18 192 lb 0.3 oz (87.1 kg)     CBC    Component Value Date/Time   WBC 4.9 07/31/2021 1006   WBC 5.1 07/28/2020 1004   RBC 4.73 07/31/2021 1006   RBC 4.82 07/28/2020 1004   HGB 14.1 07/31/2021 1006   HCT 41.9 07/31/2021 1006   PLT 203 07/31/2021 1006   MCV 89 07/31/2021 1006   MCH 29.8 07/31/2021 1006   MCH 29.3 07/28/2020 1004   MCHC 33.7 07/31/2021 1006   MCHC 33.7 07/28/2020 1004   RDW 12.9 07/31/2021 1006   LYMPHSABS 1.9 07/31/2021 1006   MONOABS 0.5 07/28/2020 1004   EOSABS 0.1 07/31/2021 1006   BASOSABS 0.0 07/31/2021 1006    ***  Chest Imaging: ***  Pulmonary Functions Testing Results:     No data to display          FeNO: ***  Pathology: ***  Echocardiogram: ***  Heart Catheterization: ***    Assessment & Plan:    Plan:      Elizabeth Hurter, MD [facility] 09/01/2021 2:36 PM

## 2021-09-04 ENCOUNTER — Ambulatory Visit (INDEPENDENT_AMBULATORY_CARE_PROVIDER_SITE_OTHER): Payer: BC Managed Care – PPO

## 2021-09-04 ENCOUNTER — Encounter: Payer: Self-pay | Admitting: Student

## 2021-09-04 ENCOUNTER — Ambulatory Visit: Payer: BC Managed Care – PPO | Admitting: Student

## 2021-09-04 VITALS — BP 116/78 | HR 96 | Temp 98.2°F | Ht 67.0 in | Wt 196.0 lb

## 2021-09-04 DIAGNOSIS — R053 Chronic cough: Secondary | ICD-10-CM | POA: Diagnosis not present

## 2021-09-04 DIAGNOSIS — J90 Pleural effusion, not elsewhere classified: Secondary | ICD-10-CM | POA: Diagnosis not present

## 2021-09-04 MED ORDER — FLUTICASONE PROPIONATE 50 MCG/ACT NA SUSP: 1.0000 | Freq: Every day | NASAL | 11 refills | Status: AC

## 2021-09-04 MED ORDER — PANTOPRAZOLE SODIUM 40 MG PO TBEC: 40.0000 mg | DELAYED_RELEASE_TABLET | Freq: Every day | ORAL | 0 refills | Status: DC

## 2021-09-04 NOTE — Patient Instructions (Addendum)
- x ray today - shower clear nose of crusting and then flonase 2 spray each nostril for first 2 weeks then 1 spray each nostril daily until next visit - continue claritin daily - start protonix 40 mg daily 30 minutes before breakfast - PFTs (breathing tests) next visit in 8 weeks. If you're feeling 100% better by then and chest x ray is normal then you can skip PFTs and just come to our appointment in 8 weeks - see you in 8 weeks or sooner if need be!    Gastroesophageal Reflux Disease, Adult  Gastroesophageal reflux (GER) happens when acid from the stomach flows up into the tube that connects the mouth and the stomach (esophagus). Normally, food travels down the esophagus and stays in the stomach to be digested. With GER, food and stomach acid sometimes move back up into the esophagus. You may have a disease called gastroesophageal reflux disease (GERD) if the reflux: Happens often. Causes frequent or very bad symptoms. Causes problems such as damage to the esophagus. When this happens, the esophagus becomes sore and swollen. Over time, GERD can make small holes (ulcers) in the lining of the esophagus. What are the causes? This condition is caused by a problem with the muscle between the esophagus and the stomach. When this muscle is weak or not normal, it does not close properly to keep food and acid from coming back up from the stomach. The muscle can be weak because of: Tobacco use. Pregnancy. Having a certain type of hernia (hiatal hernia). Alcohol use. Certain foods and drinks, such as coffee, chocolate, onions, and peppermint. What increases the risk? Being overweight. Having a disease that affects your connective tissue. Taking NSAIDs, such a ibuprofen. What are the signs or symptoms? Heartburn. Difficult or painful swallowing. The feeling of having a lump in the throat. A bitter taste in the mouth. Bad breath. Having a lot of saliva. Having an upset or bloated  stomach. Burping. Chest pain. Different conditions can cause chest pain. Make sure you see your doctor if you have chest pain. Shortness of breath or wheezing. A long-term cough or a cough at night. Wearing away of the surface of teeth (tooth enamel). Weight loss. How is this treated? Making changes to your diet. Taking medicine. Having surgery. Treatment will depend on how bad your symptoms are. Follow these instructions at home: Eating and drinking  Follow a diet as told by your doctor. You may need to avoid foods and drinks such as: Coffee and tea, with or without caffeine. Drinks that contain alcohol. Energy drinks and sports drinks. Bubbly (carbonated) drinks or sodas. Chocolate and cocoa. Peppermint and mint flavorings. Garlic and onions. Horseradish. Spicy and acidic foods. These include peppers, chili powder, curry powder, vinegar, hot sauces, and BBQ sauce. Citrus fruit juices and citrus fruits, such as oranges, lemons, and limes. Tomato-based foods. These include red sauce, chili, salsa, and pizza with red sauce. Fried and fatty foods. These include donuts, french fries, potato chips, and high-fat dressings. High-fat meats. These include hot dogs, rib eye steak, sausage, ham, and bacon. High-fat dairy items, such as whole milk, butter, and cream cheese. Eat small meals often. Avoid eating large meals. Avoid drinking large amounts of liquid with your meals. Avoid eating meals during the 2-3 hours before bedtime. Avoid lying down right after you eat. Do not exercise right after you eat. Lifestyle  Do not smoke or use any products that contain nicotine or tobacco. If you need help quitting, ask  your doctor. Try to lower your stress. If you need help doing this, ask your doctor. If you are overweight, lose an amount of weight that is healthy for you. Ask your doctor about a safe weight loss goal. General instructions Pay attention to any changes in your symptoms. Take  over-the-counter and prescription medicines only as told by your doctor. Do not take aspirin, ibuprofen, or other NSAIDs unless your doctor says it is okay. Wear loose clothes. Do not wear anything tight around your waist. Raise (elevate) the head of your bed about 6 inches (15 cm). You may need to use a wedge to do this. Avoid bending over if this makes your symptoms worse. Keep all follow-up visits. Contact a doctor if: You have new symptoms. You lose weight and you do not know why. You have trouble swallowing or it hurts to swallow. You have wheezing or a cough that keeps happening. You have a hoarse voice. Your symptoms do not get better with treatment. Get help right away if: You have sudden pain in your arms, neck, jaw, teeth, or back. You suddenly feel sweaty, dizzy, or light-headed. You have chest pain or shortness of breath. You vomit and the vomit is green, yellow, or black, or it looks like blood or coffee grounds. You faint. Your poop (stool) is red, bloody, or black. You cannot swallow, drink, or eat. These symptoms may represent a serious problem that is an emergency. Do not wait to see if the symptoms will go away. Get medical help right away. Call your local emergency services (911 in the [place] Do not drive yourself to the hospital. Summary If a person has gastroesophageal reflux disease (GERD), food and stomach acid move back up into the esophagus and cause symptoms or problems such as damage to the esophagus. Treatment will depend on how bad your symptoms are. Follow a diet as told by your doctor. Take all medicines only as told by your doctor. This information is not intended to replace advice given to you by your health care provider. Make sure you discuss any questions you have with your health care provider. Document Revised: 08/24/2019 Document Reviewed: 08/24/2019 Elsevier Patient Education  [employer].

## 2021-09-12 ENCOUNTER — Encounter (HOSPITAL_BASED_OUTPATIENT_CLINIC_OR_DEPARTMENT_OTHER): Payer: Self-pay | Admitting: Nurse Practitioner

## 2021-09-19 DIAGNOSIS — M9904 Segmental and somatic dysfunction of sacral region: Secondary | ICD-10-CM | POA: Diagnosis not present

## 2021-09-19 DIAGNOSIS — M9902 Segmental and somatic dysfunction of thoracic region: Secondary | ICD-10-CM | POA: Diagnosis not present

## 2021-09-19 DIAGNOSIS — M9903 Segmental and somatic dysfunction of lumbar region: Secondary | ICD-10-CM | POA: Diagnosis not present

## 2021-09-19 DIAGNOSIS — M9901 Segmental and somatic dysfunction of cervical region: Secondary | ICD-10-CM | POA: Diagnosis not present

## 2021-10-04 ENCOUNTER — Encounter (HOSPITAL_BASED_OUTPATIENT_CLINIC_OR_DEPARTMENT_OTHER): Payer: Self-pay | Admitting: Nurse Practitioner

## 2021-10-06 NOTE — Telephone Encounter (Signed)
Elizabeth Carpenter received a denial of coverage for her annual physical exam due to a taxonomy code of "Geriatric". My taxonomy should be Adult-Geriatric or even Internal Medicine based on the age range I see. Can you look into this to see why this would be going through the way it is and what we need to do to correct this? Please see patient [name] message for more information.   Thank you SaraBeth

## 2021-10-09 NOTE — Telephone Encounter (Signed)
Communicating with pro fee to identify error with submitted claim. Awaiting response.

## 2021-10-24 DIAGNOSIS — M9903 Segmental and somatic dysfunction of lumbar region: Secondary | ICD-10-CM | POA: Diagnosis not present

## 2021-10-24 DIAGNOSIS — M9902 Segmental and somatic dysfunction of thoracic region: Secondary | ICD-10-CM | POA: Diagnosis not present

## 2021-10-24 DIAGNOSIS — M9904 Segmental and somatic dysfunction of sacral region: Secondary | ICD-10-CM | POA: Diagnosis not present

## 2021-10-24 DIAGNOSIS — M9901 Segmental and somatic dysfunction of cervical region: Secondary | ICD-10-CM | POA: Diagnosis not present

## 2021-10-26 NOTE — Progress Notes (Unsigned)
Name: Elizabeth Carpenter  MRN/ DOB: [id number], 10-May-1960    Age/ Sex: 61 y.o., female    PCP: Orma Render, NP   Reason for Endocrinology Evaluation: Hyperthyroidism     Date of Initial Endocrinology Evaluation: 10/27/2021     HPI: Elizabeth Carpenter is a 61 y.o. female with unremarkable  past medical history . The patient presented for initial endocrinology clinic visit on 10/27/2021 for consultative assistance with her Hyperthyroidism.   Pt has been diagnosed with sub-clinical hyperthyroidism since 07/2020 with a TSH of 0.073 uIU/mL with normal T4, these results were confirmed in 07/2021   Over the past year she has been noted with weight gain  She stopped taking Biotin a few weeks ago   Denies palpitations  Denies tremors Has loose stools after coffee  She does feel her neck is thicker Has not felt herself in a while  Her main issue is the fatigue over the last year   Denies prior Rx   She was seen by [facility] in 11/2021. Thyroid up   Mother and brother with thyroid disease    HISTORY:  Past Medical History:  Past Medical History:  Diagnosis Date   Anemia    Fibroids    History of blood transfusion    Patient had a transfusion at birth [phone]   Seasonal allergies    SVD (spontaneous vaginal delivery)    x 2   Past Surgical History:  Past Surgical History:  Procedure Laterality Date   BREAST SURGERY  2006   reduction   COLONOSCOPY     DILATION AND CURETTAGE OF UTERUS     HYSTERECTOMY ABDOMINAL WITH SALPINGO-OOPHORECTOMY Bilateral 02/27/2018   Procedure: possible HYSTERECTOMY ABDOMINAL WITH SALPINGO-OOPHORECTOMY;  Surgeon: Louretta Shorten, MD;  Location: [facility];  Service: Gynecology;  Laterality: Bilateral;   LAPAROSCOPIC VAGINAL HYSTERECTOMY WITH SALPINGO OOPHORECTOMY Bilateral 02/27/2018   Procedure: LAPAROSCOPIC ASSISTED VAGINAL HYSTERECTOMY WITH SALPINGO OOPHORECTOMY;  Surgeon: Louretta Shorten, MD;  Location: [facility];  Service: Gynecology;  Laterality: Bilateral;    UPPER GI ENDOSCOPY     WISDOM TOOTH EXTRACTION      Social History:  reports that she has never smoked. She has never used smokeless tobacco. She reports current alcohol use of about 3.0 - 5.0 standard drinks of alcohol per week. She reports that she does not use drugs. Family History: family history is not on file.   HOME MEDICATIONS: Allergies as of 10/27/2021       Reactions   Morphine And Related Nausea And Vomiting   Amoxicillin Rash        Medication List        Accurate as of October 27, 2021  8:12 AM. If you have any questions, ask your nurse or doctor.          Calcium Carbonate 500 MG Chew Chew by mouth.   cyanocobalamin 1000 MCG tablet Commonly known as: VITAMIN B12 Take 1,000 mcg by mouth daily.   D 1000 25 MCG (1000 UT) capsule Generic drug: Cholecalciferol Take 1,000 Units by mouth daily.   Estroven Complete 4 MG Tabs Generic drug: Rhubarb Take by mouth.   fluticasone 50 MCG/ACT nasal spray Commonly known as: FLONASE Place 1 spray into both nostrils daily.   loratadine 10 MG tablet Commonly known as: CLARITIN Take 10 mg by mouth daily as needed for allergies.   multivitamin with minerals Tabs tablet Take 1 tablet by mouth daily.   pantoprazole 40 MG tablet Commonly known as: Protonix  Take 1 tablet (40 mg total) by mouth daily.   Premarin vaginal cream Generic drug: conjugated estrogens Premarin 0.625 mg/gram vaginal cream  Insert 0.5 applicatorsful twice a week by vaginal route.          REVIEW OF SYSTEMS: A comprehensive ROS was conducted with the patient and is negative except as per HPI and below:  ROS     OBJECTIVE:  VS: BP 112/74 (BP Location: Left Arm, Patient Position: Sitting, Cuff Size: Small)   Pulse 69   Ht '5\' 7"'$  (1.702 m)   Wt 196 lb 12.8 oz (89.3 kg)   LMP  (LMP Unknown)   SpO2 98%   BMI 30.82 kg/m    Wt Readings from Last 3 Encounters:  10/27/21 196 lb 12.8 oz (89.3 kg)  09/04/21 196 lb (88.9 kg)   07/31/21 196 lb 9.6 oz (89.2 kg)     EXAM: General: Pt appears well and is in NAD  Eyes: External eye exam normal without stare, lid lag or exophthalmos.  EOM intact.  PERRL.  Neck: General: Supple without adenopathy. Thyroid: Thyroid size normal.  No goiter or nodules appreciated.   Lungs: Clear with good BS bilat with no rales, rhonchi, or wheezes  Heart: Auscultation: RRR.  Abdomen: Normoactive bowel sounds, soft, nontender, without masses or organomegaly palpable  Extremities:  BL LE: No pretibial edema normal ROM and strength.  Mental Status: Judgment, insight: Intact Orientation: Oriented to time, place, and person Memory: Intact for recent and remote events Mood and affect: No depression, anxiety, or agitation     DATA REVIEWED: ***    FNA left nodule 83/66/2947 Follicular lesion of undetermined significance ( [place] III)  ASSESSMENT/PLAN/RECOMMENDATIONS:   Subclinical hyperthyroidism :    Medications :   2. Left thyroid nodule :  Signed electronically by: Mack Guise, MD  [facility]  [address] [place], [place] [address] Phone: [phone] FAX: [phone]   CC: Orma Render, NP [address] [place] [address] Phone: [phone] Fax: [phone]   Return to Endocrinology clinic as below: Future Appointments  Date Time Provider [place]  11/08/2021 10:00 AM [facility] PFT ROOM [facility] None  11/08/2021 11:00 AM Maryjane Hurter, MD [facility] None

## 2021-10-27 ENCOUNTER — Ambulatory Visit: Payer: BC Managed Care – PPO | Admitting: Internal Medicine

## 2021-10-27 ENCOUNTER — Encounter: Payer: Self-pay | Admitting: Internal Medicine

## 2021-10-27 VITALS — BP 112/74 | HR 69 | Ht 67.0 in | Wt 196.8 lb

## 2021-10-27 DIAGNOSIS — E041 Nontoxic single thyroid nodule: Secondary | ICD-10-CM | POA: Diagnosis not present

## 2021-10-27 DIAGNOSIS — E0590 Thyrotoxicosis, unspecified without thyrotoxic crisis or storm: Secondary | ICD-10-CM | POA: Diagnosis not present

## 2021-10-27 LAB — T4, FREE: Free T4: 0.7 ng/dL (ref 0.60–1.60)

## 2021-10-27 LAB — CBC
HCT: 40.1 % (ref 36.0–46.0)
Hemoglobin: 13.5 g/dL (ref 12.0–15.0)
MCHC: 33.7 g/dL (ref 30.0–36.0)
MCV: 89 fl (ref 78.0–100.0)
Platelets: 211 10*3/uL (ref 150.0–400.0)
RBC: 4.51 Mil/uL (ref 3.87–5.11)
RDW: 13.3 % (ref 11.5–15.5)
WBC: 4.5 10*3/uL (ref 4.0–10.5)

## 2021-10-27 LAB — COMPREHENSIVE METABOLIC PANEL
ALT: 26 U/L (ref 0–35)
AST: 19 U/L (ref 0–37)
Albumin: 4.4 g/dL (ref 3.5–5.2)
Alkaline Phosphatase: 42 U/L (ref 39–117)
BUN: 16 mg/dL (ref 6–23)
CO2: 28 mEq/L (ref 19–32)
Calcium: 9.4 mg/dL (ref 8.4–10.5)
Chloride: 107 mEq/L (ref 96–112)
Creatinine, Ser: 0.93 mg/dL (ref 0.40–1.20)
GFR: 67.36 mL/min (ref >60.00)
Glucose, Bld: 99 mg/dL (ref 70–99)
Potassium: 3.9 mEq/L (ref 3.5–5.1)
Sodium: 143 mEq/L (ref 135–145)
Total Bilirubin: 0.6 mg/dL (ref 0.2–1.2)
Total Protein: 7.3 g/dL (ref 6.0–8.3)

## 2021-10-27 LAB — TSH: TSH: 0.18 u[IU]/mL — ABNORMAL LOW (ref 0.35–5.50)

## 2021-10-27 MED ORDER — METHIMAZOLE 5 MG PO TABS: 2.5000 mg | ORAL_TABLET | Freq: Every day | ORAL | 1 refills | Status: DC

## 2021-10-28 LAB — T3: T3, Total: 116 ng/dL (ref 76–181)

## 2021-11-06 NOTE — Progress Notes (Unsigned)
Synopsis: Referred for chronic cough by Early, Coralee Pesa, NP  Subjective:   PATIENT ID: Elizabeth Carpenter GENDER: female DOB: 09-04-1960, MRN: [id number]  No chief complaint on file.  61yF with history of anemia, fibroids, seasonal allergy, never smoker, thyroid nodules and subclinical hyperthyroid, covid-19 2020 and fall 2022 referred for chronic cough.  She wasn't hospitalized with either covid infection. Severe symptoms though. Not vaccinated for covid-19.  She says since first covid-19 infection has had cough. Great deal of sinus congestion and PND, chest congestion. Some wheeze she notes in the morning. She has no DOE. She has some acid reflux, takes tums over the counter. She takes claritin daily for PND, flonase but only when it's bad, mucinex.   She has never had PFTs.   She did have GI specialist in 1995 when she had severe GERD. EGD then doesn't recall being told that she had anything other than severe acid reflux.   She has no family history of lung disease  She works in Museum/gallery curator. She has no exposures to dusts/solvents/particulates without mask. Has golden doodle. She never smoked, no MJ, vaping. She has lived in [place], [place], [place], [place], [place]   Interval HPI: Started on flonase, ppi last visit, continued claritin  PFTs    Otherwise pertinent review of systems is negative.  Past Medical History:  Diagnosis Date   Anemia    Fibroids    History of blood transfusion    Patient had a transfusion at birth [phone]   Seasonal allergies    SVD (spontaneous vaginal delivery)    x 2     No family history on file.   Past Surgical History:  Procedure Laterality Date   BREAST SURGERY  2006   reduction   COLONOSCOPY     DILATION AND CURETTAGE OF UTERUS     HYSTERECTOMY ABDOMINAL WITH SALPINGO-OOPHORECTOMY Bilateral 02/27/2018   Procedure: possible HYSTERECTOMY ABDOMINAL WITH SALPINGO-OOPHORECTOMY;  Surgeon: Louretta Shorten, MD;  Location: [facility];  Service: Gynecology;   Laterality: Bilateral;   LAPAROSCOPIC VAGINAL HYSTERECTOMY WITH SALPINGO OOPHORECTOMY Bilateral 02/27/2018   Procedure: LAPAROSCOPIC ASSISTED VAGINAL HYSTERECTOMY WITH SALPINGO OOPHORECTOMY;  Surgeon: Louretta Shorten, MD;  Location: [facility];  Service: Gynecology;  Laterality: Bilateral;   UPPER GI ENDOSCOPY     WISDOM TOOTH EXTRACTION      Social History   Socioeconomic History   Marital status: Married    Spouse name: Not on file   Number of children: 2   Years of education: Not on file   Highest education level: Not on file  Occupational History   Not on file  Tobacco Use   Smoking status: Never   Smokeless tobacco: Never  Vaping Use   Vaping Use: Never used  Substance and Sexual Activity   Alcohol use: Yes    Alcohol/week: 3.0 - 5.0 standard drinks of alcohol    Types: 3 - 5 Glasses of wine per week   Drug use: Never   Sexual activity: Yes    Birth control/protection: Post-menopausal  Other Topics Concern   Not on file  Social History Narrative   Not on file   Social Determinants of Health   Financial Resource Strain: Not on file  Food Insecurity: Not on file  Transportation Needs: Not on file  Physical Activity: Not on file  Stress: Not on file  Social Connections: Not on file  Intimate Partner Violence: Not on file     Allergies  Allergen Reactions   Morphine And  Related Nausea And Vomiting   Amoxicillin Rash     Outpatient Medications Prior to Visit  Medication Sig Dispense Refill   Calcium Carbonate 500 MG CHEW Chew by mouth.     conjugated estrogens (PREMARIN) vaginal cream Premarin 0.625 mg/gram vaginal cream  Insert 0.5 applicatorsful twice a week by vaginal route. (Patient not taking: Reported on 09/04/2021)     D 1000 25 MCG (1000 UT) capsule Take 1,000 Units by mouth daily.     fluticasone (FLONASE) 50 MCG/ACT nasal spray Place 1 spray into both nostrils daily. 16 g 11   loratadine (CLARITIN) 10 MG tablet Take 10 mg by mouth daily as needed for  allergies.     methimazole (TAPAZOLE) 5 MG tablet Take 0.5 tablets (2.5 mg total) by mouth daily. 45 tablet 1   Multiple Vitamin (MULTIVITAMIN WITH MINERALS) TABS tablet Take 1 tablet by mouth daily.     pantoprazole (PROTONIX) 40 MG tablet Take 1 tablet (40 mg total) by mouth daily. 90 tablet 0   Rhubarb (ESTROVEN COMPLETE) 4 MG TABS Take by mouth. (Patient not taking: Reported on 10/27/2021)     vitamin B-12 (CYANOCOBALAMIN) 1000 MCG tablet Take 1,000 mcg by mouth daily.     No facility-administered medications prior to visit.       Objective:   Physical Exam:  General appearance: 61 y.o., female, NAD, conversant  Eyes: anicteric sclerae; PERRL, tracking appropriately HENT: NCAT; MMM Neck: Trachea midline; no lymphadenopathy, no JVD Lungs: CTAB, no crackles, no wheeze, with normal respiratory effort CV: RRR, no murmur  Abdomen: Soft, non-tender; non-distended, BS present  Extremities: No peripheral edema, warm Skin: Normal turgor and texture; no rash Psych: Appropriate affect Neuro: Alert and oriented to person and place, no focal deficit     There were no vitals filed for this visit.    on RA BMI Readings from Last 3 Encounters:  10/27/21 30.82 kg/m  09/04/21 30.70 kg/m  07/31/21 31.26 kg/m   Wt Readings from Last 3 Encounters:  10/27/21 196 lb 12.8 oz (89.3 kg)  09/04/21 196 lb (88.9 kg)  07/31/21 196 lb 9.6 oz (89.2 kg)     CBC    Component Value Date/Time   WBC 4.5 10/27/2021 0858   RBC 4.51 10/27/2021 0858   HGB 13.5 10/27/2021 0858   HGB 14.1 07/31/2021 1006   HCT 40.1 10/27/2021 0858   HCT 41.9 07/31/2021 1006   PLT 211.0 10/27/2021 0858   PLT 203 07/31/2021 1006   MCV 89.0 10/27/2021 0858   MCV 89 07/31/2021 1006   MCH 29.8 07/31/2021 1006   MCH 29.3 07/28/2020 1004   MCHC 33.7 10/27/2021 0858   RDW 13.3 10/27/2021 0858   RDW 12.9 07/31/2021 1006   LYMPHSABS 1.9 07/31/2021 1006   MONOABS 0.5 07/28/2020 1004   EOSABS 0.1 07/31/2021 1006    BASOSABS 0.0 07/31/2021 1006    Chest Imaging: CXR 09/04/21 reviewed by me unremarkable  Pulmonary Functions Testing Results:     No data to display              Assessment & Plan:   # Chronic cough Does have symptomatic GERD and PND. Prior covid and borderline O2 saturation raise question of ILD however.   Plan: - x ray today - shower clear nose of crusting and then flonase 2 spray each nostril for first 2 weeks then 1 spray each nostril daily until next visit - continue claritin daily - start protonix 40 mg daily 30 minutes before  breakfast, GERD TLC measures reviewed - PFTs (breathing tests) next visit in 8 weeks. If you're feeling 100% better by then and chest x ray is normal then you can skip PFTs and just come to our appointment in 8 weeks - see you in 8 weeks or sooner if need be!     Maryjane Hurter, MD [facility] 11/06/2021 2:38 PM

## 2021-11-07 ENCOUNTER — Ambulatory Visit: Payer: BC Managed Care – PPO | Admitting: Internal Medicine

## 2021-11-08 ENCOUNTER — Ambulatory Visit: Payer: BC Managed Care – PPO | Admitting: Student

## 2021-11-08 ENCOUNTER — Encounter: Payer: Self-pay | Admitting: Student

## 2021-11-08 VITALS — BP 108/70 | HR 67 | Ht 67.0 in | Wt 195.2 lb

## 2021-11-08 DIAGNOSIS — R053 Chronic cough: Secondary | ICD-10-CM

## 2021-11-08 NOTE — Patient Instructions (Addendum)
- shower clear nose of crusting and then flonase 2 spray each nostril for first 2 weeks then 1 spray each nostril daily until next visit - continue claritin daily - would stop protonix. If cough worsens then resume it and it might be a good idea to try to schedule appointment with GI to see if there's any underlying reason for you to have symptomatic reflux. - happy to see you again if cough is back!   Gastroesophageal Reflux Disease, Adult  Gastroesophageal reflux (GER) happens when acid from the stomach flows up into the tube that connects the mouth and the stomach (esophagus). Normally, food travels down the esophagus and stays in the stomach to be digested. With GER, food and stomach acid sometimes move back up into the esophagus. You may have a disease called gastroesophageal reflux disease (GERD) if the reflux: Happens often. Causes frequent or very bad symptoms. Causes problems such as damage to the esophagus. When this happens, the esophagus becomes sore and swollen. Over time, GERD can make small holes (ulcers) in the lining of the esophagus. What are the causes? This condition is caused by a problem with the muscle between the esophagus and the stomach. When this muscle is weak or not normal, it does not close properly to keep food and acid from coming back up from the stomach. The muscle can be weak because of: Tobacco use. Pregnancy. Having a certain type of hernia (hiatal hernia). Alcohol use. Certain foods and drinks, such as coffee, chocolate, onions, and peppermint. What increases the risk? Being overweight. Having a disease that affects your connective tissue. Taking NSAIDs, such a ibuprofen. What are the signs or symptoms? Heartburn. Difficult or painful swallowing. The feeling of having a lump in the throat. A bitter taste in the mouth. Bad breath. Having a lot of saliva. Having an upset or bloated stomach. Burping. Chest pain. Different conditions can cause  chest pain. Make sure you see your doctor if you have chest pain. Shortness of breath or wheezing. A long-term cough or a cough at night. Wearing away of the surface of teeth (tooth enamel). Weight loss. How is this treated? Making changes to your diet. Taking medicine. Having surgery. Treatment will depend on how bad your symptoms are. Follow these instructions at home: Eating and drinking  Follow a diet as told by your doctor. You may need to avoid foods and drinks such as: Coffee and tea, with or without caffeine. Drinks that contain alcohol. Energy drinks and sports drinks. Bubbly (carbonated) drinks or sodas. Chocolate and cocoa. Peppermint and mint flavorings. Garlic and onions. Horseradish. Spicy and acidic foods. These include peppers, chili powder, curry powder, vinegar, hot sauces, and BBQ sauce. Citrus fruit juices and citrus fruits, such as oranges, lemons, and limes. Tomato-based foods. These include red sauce, chili, salsa, and pizza with red sauce. Fried and fatty foods. These include donuts, french fries, potato chips, and high-fat dressings. High-fat meats. These include hot dogs, rib eye steak, sausage, ham, and bacon. High-fat dairy items, such as whole milk, butter, and cream cheese. Eat small meals often. Avoid eating large meals. Avoid drinking large amounts of liquid with your meals. Avoid eating meals during the 2-3 hours before bedtime. Avoid lying down right after you eat. Do not exercise right after you eat. Lifestyle  Do not smoke or use any products that contain nicotine or tobacco. If you need help quitting, ask your doctor. Try to lower your stress. If you need help doing this, ask your  doctor. If you are overweight, lose an amount of weight that is healthy for you. Ask your doctor about a safe weight loss goal. General instructions Pay attention to any changes in your symptoms. Take over-the-counter and prescription medicines only as told by  your doctor. Do not take aspirin, ibuprofen, or other NSAIDs unless your doctor says it is okay. Wear loose clothes. Do not wear anything tight around your waist. Raise (elevate) the head of your bed about 6 inches (15 cm). You may need to use a wedge to do this. Avoid bending over if this makes your symptoms worse. Keep all follow-up visits. Contact a doctor if: You have new symptoms. You lose weight and you do not know why. You have trouble swallowing or it hurts to swallow. You have wheezing or a cough that keeps happening. You have a hoarse voice. Your symptoms do not get better with treatment. Get help right away if: You have sudden pain in your arms, neck, jaw, teeth, or back. You suddenly feel sweaty, dizzy, or light-headed. You have chest pain or shortness of breath. You vomit and the vomit is green, yellow, or black, or it looks like blood or coffee grounds. You faint. Your poop (stool) is red, bloody, or black. You cannot swallow, drink, or eat. These symptoms may represent a serious problem that is an emergency. Do not wait to see if the symptoms will go away. Get medical help right away. Call your local emergency services (911 in the [place] Do not drive yourself to the hospital. Summary If a person has gastroesophageal reflux disease (GERD), food and stomach acid move back up into the esophagus and cause symptoms or problems such as damage to the esophagus. Treatment will depend on how bad your symptoms are. Follow a diet as told by your doctor. Take all medicines only as told by your doctor. This information is not intended to replace advice given to you by your health care provider. Make sure you discuss any questions you have with your health care provider. Document Revised: 08/24/2019 Document Reviewed: 08/24/2019 Elsevier Patient Education  [employer].

## 2021-11-28 DIAGNOSIS — L820 Inflamed seborrheic keratosis: Secondary | ICD-10-CM | POA: Diagnosis not present

## 2021-11-30 DIAGNOSIS — M9903 Segmental and somatic dysfunction of lumbar region: Secondary | ICD-10-CM | POA: Diagnosis not present

## 2021-11-30 DIAGNOSIS — M9902 Segmental and somatic dysfunction of thoracic region: Secondary | ICD-10-CM | POA: Diagnosis not present

## 2021-11-30 DIAGNOSIS — M9901 Segmental and somatic dysfunction of cervical region: Secondary | ICD-10-CM | POA: Diagnosis not present

## 2021-11-30 DIAGNOSIS — M9904 Segmental and somatic dysfunction of sacral region: Secondary | ICD-10-CM | POA: Diagnosis not present

## 2021-12-05 DIAGNOSIS — Z6831 Body mass index (BMI) 31.0-31.9, adult: Secondary | ICD-10-CM | POA: Diagnosis not present

## 2021-12-05 DIAGNOSIS — Z1231 Encounter for screening mammogram for malignant neoplasm of breast: Secondary | ICD-10-CM | POA: Diagnosis not present

## 2021-12-05 DIAGNOSIS — Z01419 Encounter for gynecological examination (general) (routine) without abnormal findings: Secondary | ICD-10-CM | POA: Diagnosis not present

## 2021-12-22 ENCOUNTER — Other Ambulatory Visit (INDEPENDENT_AMBULATORY_CARE_PROVIDER_SITE_OTHER): Payer: BC Managed Care – PPO

## 2021-12-22 DIAGNOSIS — E0590 Thyrotoxicosis, unspecified without thyrotoxic crisis or storm: Secondary | ICD-10-CM | POA: Diagnosis not present

## 2021-12-22 LAB — T4, FREE: Free T4: 0.62 ng/dL (ref 0.60–1.60)

## 2021-12-22 LAB — TSH: TSH: 1.21 u[IU]/mL (ref 0.35–5.50)

## 2021-12-23 LAB — T3: T3, Total: 90 ng/dL (ref 76–181)

## 2022-03-16 ENCOUNTER — Ambulatory Visit: Payer: BC Managed Care – PPO | Admitting: Internal Medicine

## 2022-03-16 ENCOUNTER — Encounter: Payer: Self-pay | Admitting: Internal Medicine

## 2022-03-16 VITALS — BP 124/82 | HR 87 | Ht 67.0 in | Wt 194.6 lb

## 2022-03-16 DIAGNOSIS — E041 Nontoxic single thyroid nodule: Secondary | ICD-10-CM

## 2022-03-16 DIAGNOSIS — E0590 Thyrotoxicosis, unspecified without thyrotoxic crisis or storm: Secondary | ICD-10-CM

## 2022-03-16 DIAGNOSIS — M2550 Pain in unspecified joint: Secondary | ICD-10-CM

## 2022-03-16 LAB — TSH: TSH: 0.88 u[IU]/mL (ref 0.35–5.50)

## 2022-03-16 LAB — T4, FREE: Free T4: 1.48 ng/dL (ref 0.60–1.60)

## 2022-03-16 NOTE — Progress Notes (Signed)
Name: Elizabeth Carpenter  MRN/ DOB: [id number], 12-17-1960    Age/ Sex: 62 y.o., female    PCP: Orma Render, NP   Reason for Endocrinology Evaluation: Hyperthyroidism     Date of Initial Endocrinology Evaluation: 10/27/2021    HPI: [name] is a 61 y.o. female with unremarkable  past medical history . The patient presented for initial endocrinology clinic visit on 10/27/2021 for consultative assistance with her Hyperthyroidism.   Pt has been diagnosed with sub-clinical hyperthyroidism since 07/2020 with a TSH of 0.073 uIU/mL with normal T4, these results were confirmed in 07/2021    She was seen by [facility]  on 09/19/2020, Anti-TPO were normal at 3 IU/mL, TRAb was normal at <1.1 IU/L ( reference 0.00-1.75).  Uptake and scan on 11/11/2020 showed a focal area of photopenia noted at the left mid pole of the lobe,no other discrete hot or cold nodule identified. 24-Hr uptake was normal at 24.6%  A thyroid ultrasound on 12/12/2020 showed left mid to lower pole spongiform nodule with marked peripheral calcifications but no other nodules.   She is S/P FNA of the left nodule 12/12/2020 with a cytology report showing follicular lesion [facility] category III) . Thyroseq was negative per chart note   No XRT  Started methimazole 10/2021 with a TSH of 0.18 uIU/mL and fatigue  Mother and brother with thyroid disease    SUBJECTIVE:    Today (03/16/22): Elizabeth Carpenter is here for follow-up on thyroid nodule and hyperthyroidism.   Pt has been noted with weight loss  Slight improvement in enegy, does not wake up with rest  She is not sure that she snores She is on estrogen patch but has not taken it  Has not taken biotin  Denies locla neck swelling  Denies palpitations  Denies tremors Has noted achiness and stiffness    Methimazole 5 mg, half a tablet daily    HISTORY:  Past Medical History:  Past Medical History:  Diagnosis Date   Anemia    Fibroids     History of blood transfusion    Patient had a transfusion at birth in1962   Seasonal allergies    SVD (spontaneous vaginal delivery)    x 2   Past Surgical History:  Past Surgical History:  Procedure Laterality Date   BREAST SURGERY  2006   reduction   COLONOSCOPY     DILATION AND CURETTAGE OF UTERUS     HYSTERECTOMY ABDOMINAL WITH SALPINGO-OOPHORECTOMY Bilateral 02/27/2018   Procedure: possible HYSTERECTOMY ABDOMINAL WITH SALPINGO-OOPHORECTOMY;  Surgeon: Louretta Shorten, MD;  Location: [facility];  Service: Gynecology;  Laterality: Bilateral;   LAPAROSCOPIC VAGINAL HYSTERECTOMY WITH SALPINGO OOPHORECTOMY Bilateral 02/27/2018   Procedure: LAPAROSCOPIC ASSISTED VAGINAL HYSTERECTOMY WITH SALPINGO OOPHORECTOMY;  Surgeon: Louretta Shorten, MD;  Location: [facility];  Service: Gynecology;  Laterality: Bilateral;   UPPER GI ENDOSCOPY     WISDOM TOOTH EXTRACTION      Social History:  reports that she has never smoked. She has never used smokeless tobacco. She reports current alcohol use of about 3.0 - 5.0 standard drinks of alcohol per week. She reports that she does not use drugs. Family History: family history is not on file.   HOME MEDICATIONS: Allergies as of 03/16/2022       Reactions   Morphine And Related Nausea And Vomiting   Amoxicillin Rash        Medication List        Accurate as of March 16, 2022 12:06  PM. If you have any questions, ask your nurse or doctor.          Calcium Carbonate 500 MG Chew Chew by mouth.   cyanocobalamin 1000 MCG tablet Commonly known as: VITAMIN B12 Take 1,000 mcg by mouth daily.   D 1000 25 MCG (1000 UT) capsule Generic drug: Cholecalciferol Take 1,000 Units by mouth daily.   Estroven Complete 4 MG Tabs Generic drug: Rhubarb Take by mouth.   fluticasone 50 MCG/ACT nasal spray Commonly known as: FLONASE Place 1 spray into both nostrils daily.   loratadine 10 MG tablet Commonly known as: CLARITIN Take 10 mg by mouth daily as needed for  allergies.   methimazole 5 MG tablet Commonly known as: TAPAZOLE Take 0.5 tablets (2.5 mg total) by mouth daily.   multivitamin with minerals Tabs tablet Take 1 tablet by mouth daily.   pantoprazole 40 MG tablet Commonly known as: Protonix Take 1 tablet (40 mg total) by mouth daily.   Premarin vaginal cream Generic drug: conjugated estrogens          REVIEW OF SYSTEMS: A comprehensive ROS was conducted with the patient and is negative except as per HPI   OBJECTIVE:  VS: BP 124/82 (BP Location: Left Arm, Patient Position: Sitting, Cuff Size: Normal)   Pulse 87   Ht '5\' 7"'$  (1.702 m)   Wt 194 lb 9.6 oz (88.3 kg)   LMP  (LMP Unknown)   SpO2 97%   BMI 30.48 kg/m    Wt Readings from Last 3 Encounters:  03/16/22 194 lb 9.6 oz (88.3 kg)  11/08/21 195 lb 3.2 oz (88.5 kg)  10/27/21 196 lb 12.8 oz (89.3 kg)     EXAM: General: Pt appears well and is in NAD  Eyes: External eye exam normal without stare, lid lag or exophthalmos.  EOM intact.    Neck: General: Supple without adenopathy. Thyroid: Thyroid size normal.  Left  nodule appreciated.   Lungs: Clear with good BS bilat with no rales, rhonchi, or wheezes  Heart: Auscultation: RRR.  Abdomen: Normoactive bowel sounds, soft, nontender, without masses or organomegaly palpable  Extremities:  BL LE: No pretibial edema normal ROM and strength.  Mental Status: Judgment, insight: Intact Orientation: Oriented to time, place, and person Mood and affect: No depression, anxiety, or agitation     DATA REVIEWED:   Latest Reference Range & Units 03/16/22 12:22  TSH 0.35 - 5.50 uIU/mL 0.88  Triiodothyronine (T3) 76 - 181 ng/dL 102  T4,Free(Direct) 0.60 - 1.60 ng/dL 1.48       Latest Reference Range & Units 10/27/21 08:58  Sodium 135 - 145 mEq/L 143  Potassium 3.5 - 5.1 mEq/L 3.9  Chloride 96 - 112 mEq/L 107  CO2 19 - 32 mEq/L 28  Glucose 70 - 99 mg/dL 99  BUN 6 - 23 mg/dL 16  Creatinine 0.40 - 1.20 mg/dL 0.93  Calcium  8.4 - 10.5 mg/dL 9.4  Alkaline Phosphatase 39 - 117 U/L 42  Albumin 3.5 - 5.2 g/dL 4.4  AST 0 - 37 U/L 19  ALT 0 - 35 U/L 26  Total Protein 6.0 - 8.3 g/dL 7.3  Total Bilirubin 0.2 - 1.2 mg/dL 0.6  GFR >60.00 mL/min 67.36    Latest Reference Range & Units 10/27/21 08:58  WBC 4.0 - 10.5 K/uL 4.5  RBC 3.87 - 5.11 Mil/uL 4.51  Hemoglobin 12.0 - 15.0 g/dL 13.5  HCT 36.0 - 46.0 % 40.1  MCV 78.0 - 100.0 fl 89.0  MCHC 30.0 -  36.0 g/dL 33.7  RDW 11.5 - 15.5 % 13.3  Platelets 150.0 - 400.0 K/uL 211.0       FNA left nodule 50/38/8828 Follicular lesion of undetermined significance ( Bethesda III)   Thyroid ULtrasound 12/12/2020  Left Nodule 1.  Left mid to lower lobe essentially replaced with  spongiform appearing nodule marked peripheral flow dense calcifications  There are no abnormal lymph nodes seen in the left or right neck levels  II-VI including VA/VB   Assessment and Plan:   1.  Dominant left lobe thyroid nodule meets criteria for FNA  2. Disposition.  See separate FNA report    Thyroid uptake and scan 11/11/2020 FINDINGS:   Thyroid: Focal area of photopenia noted at the mid pole of the left thyroid lobe corresponding to a hypodense thyroid nodule. No discrete cold or hot nodule identified in the right thyroid lobe.   Background activity: Normal.   Uptake at 24 hours is 24.6%, with upper limits of normal 30%.       ASSESSMENT/PLAN/RECOMMENDATIONS:   Subclinical hyperthyroidism :   - We discussed D/D of Graves' disease vs autonomous thyroid nodule , given FH of thyroid disease she is at high risk for Graves' disease.  - She has joint pains, which I discussed is difficult to ascertain if this related to methimazole vs arthritis, the only way to figure this out is to hold methimazole for 2 weeks and see how it does. High suspicion for arthritis   - TFT's normal today   Medications : Continue Methimazole 5 mg, half a tablet daily     2. Left thyroid nodule  :  - Thyroid uptake and scan showed a " COLD" left thyroid nodule with 24% uptake at 24 hrs.  - She is S/P FNA of the left dominant nodule with follicular lesion of undetermined significance (Bethesda Category III), per chart through care everywhere, thyroseq was benign and she signed ROI to obtain that report from [facility] as I am unable to see the report.  - Will proceed with repeat thyroid ultrasound   3. Arthralgia :  - Most likely OA in origin - Pt may do stretching exercise    F/U in 4 months    Signed electronically by: Mack Guise, MD  [facility]  [facility] [address] [place], [place] [address] Phone: [phone] FAX: [phone]   CC: Orma Render, NP [address] [place] [address] Phone: [phone] Fax: [phone]   Return to Endocrinology clinic as below: No future appointments.

## 2022-03-17 LAB — T3: T3, Total: 102 ng/dL (ref 76–181)

## 2022-03-19 DIAGNOSIS — M9902 Segmental and somatic dysfunction of thoracic region: Secondary | ICD-10-CM | POA: Diagnosis not present

## 2022-03-19 DIAGNOSIS — M9901 Segmental and somatic dysfunction of cervical region: Secondary | ICD-10-CM | POA: Diagnosis not present

## 2022-03-19 DIAGNOSIS — M9904 Segmental and somatic dysfunction of sacral region: Secondary | ICD-10-CM | POA: Diagnosis not present

## 2022-03-19 DIAGNOSIS — M9903 Segmental and somatic dysfunction of lumbar region: Secondary | ICD-10-CM | POA: Diagnosis not present

## 2022-03-21 ENCOUNTER — Ambulatory Visit
Admission: RE | Admit: 2022-03-21 | Discharge: 2022-03-21 | Disposition: A | Discharge status: Home / Self Care | Payer: BC Managed Care – PPO | Source: Ambulatory Visit | Attending: Internal Medicine | Admitting: Internal Medicine

## 2022-03-21 DIAGNOSIS — E041 Nontoxic single thyroid nodule: Secondary | ICD-10-CM

## 2022-03-27 ENCOUNTER — Encounter: Payer: Self-pay | Admitting: Internal Medicine

## 2022-03-27 ENCOUNTER — Telehealth: Payer: Self-pay | Admitting: Internal Medicine

## 2022-03-27 NOTE — Telephone Encounter (Signed)
Can you please contact the patient and make sure that she received my portal message regarding her thyroid ultrasound results?    Thyroid ultrasound showed that 2 of the nodules need to be biopsied, she has had a prior biopsy in the past so we will be similar process    Please let me know if she is in agreement so I can put in the order   Thanks

## 2022-03-27 NOTE — Telephone Encounter (Signed)
Left vm for patient to callback and let us know if she would like to have Dr. Kelton Pillar place the order.

## 2022-03-28 ENCOUNTER — Other Ambulatory Visit: Payer: Self-pay | Admitting: Internal Medicine

## 2022-03-28 DIAGNOSIS — E042 Nontoxic multinodular goiter: Secondary | ICD-10-CM

## 2022-03-28 NOTE — Telephone Encounter (Signed)
Patient responded to the [employer]

## 2022-04-05 DIAGNOSIS — D2272 Melanocytic nevi of left lower limb, including hip: Secondary | ICD-10-CM | POA: Diagnosis not present

## 2022-04-05 DIAGNOSIS — L821 Other seborrheic keratosis: Secondary | ICD-10-CM | POA: Diagnosis not present

## 2022-04-05 DIAGNOSIS — Z85828 Personal history of other malignant neoplasm of skin: Secondary | ICD-10-CM | POA: Diagnosis not present

## 2022-04-05 DIAGNOSIS — D225 Melanocytic nevi of trunk: Secondary | ICD-10-CM | POA: Diagnosis not present

## 2022-04-17 DIAGNOSIS — M9903 Segmental and somatic dysfunction of lumbar region: Secondary | ICD-10-CM | POA: Diagnosis not present

## 2022-04-17 DIAGNOSIS — M9904 Segmental and somatic dysfunction of sacral region: Secondary | ICD-10-CM | POA: Diagnosis not present

## 2022-04-17 DIAGNOSIS — M9902 Segmental and somatic dysfunction of thoracic region: Secondary | ICD-10-CM | POA: Diagnosis not present

## 2022-04-17 DIAGNOSIS — M9901 Segmental and somatic dysfunction of cervical region: Secondary | ICD-10-CM | POA: Diagnosis not present

## 2022-04-23 ENCOUNTER — Other Ambulatory Visit (HOSPITAL_COMMUNITY)
Admission: RE | Admit: 2022-04-23 | Discharge: 2022-04-23 | Disposition: A | Discharge status: Home / Self Care | Payer: BC Managed Care – PPO | Source: Ambulatory Visit | Attending: Internal Medicine | Admitting: Internal Medicine

## 2022-04-23 ENCOUNTER — Ambulatory Visit
Admission: RE | Admit: 2022-04-23 | Discharge: 2022-04-23 | Disposition: A | Discharge status: Home / Self Care | Payer: BC Managed Care – PPO | Source: Ambulatory Visit | Attending: Internal Medicine | Admitting: Internal Medicine

## 2022-04-23 DIAGNOSIS — E042 Nontoxic multinodular goiter: Secondary | ICD-10-CM

## 2022-04-23 NOTE — Procedures (Addendum)
Successful US guided FNA of right superior thyroid nodule Successful US guided FNA of left inferior thyroid nodule No complications. See PACS for full report.    Narda Rutherford, AGNP-BC 04/23/2022, 1:51 PM

## 2022-04-25 LAB — CYTOLOGY - NON PAP

## 2022-04-30 ENCOUNTER — Other Ambulatory Visit: Payer: Self-pay | Admitting: Internal Medicine

## 2022-04-30 DIAGNOSIS — E0590 Thyrotoxicosis, unspecified without thyrotoxic crisis or storm: Secondary | ICD-10-CM

## 2022-05-10 DIAGNOSIS — N951 Menopausal and female climacteric states: Secondary | ICD-10-CM | POA: Diagnosis not present

## 2022-05-10 DIAGNOSIS — E559 Vitamin D deficiency, unspecified: Secondary | ICD-10-CM | POA: Diagnosis not present

## 2022-05-10 DIAGNOSIS — E611 Iron deficiency: Secondary | ICD-10-CM | POA: Diagnosis not present

## 2022-05-10 DIAGNOSIS — E612 Magnesium deficiency: Secondary | ICD-10-CM | POA: Diagnosis not present

## 2022-05-10 DIAGNOSIS — G4700 Insomnia, unspecified: Secondary | ICD-10-CM | POA: Diagnosis not present

## 2022-05-10 DIAGNOSIS — E349 Endocrine disorder, unspecified: Secondary | ICD-10-CM | POA: Diagnosis not present

## 2022-05-10 DIAGNOSIS — E538 Deficiency of other specified B group vitamins: Secondary | ICD-10-CM | POA: Diagnosis not present

## 2022-06-05 ENCOUNTER — Ambulatory Visit: Payer: BC Managed Care – PPO | Admitting: Physician Assistant

## 2022-06-05 DIAGNOSIS — G4700 Insomnia, unspecified: Secondary | ICD-10-CM | POA: Diagnosis not present

## 2022-06-05 DIAGNOSIS — E612 Magnesium deficiency: Secondary | ICD-10-CM | POA: Diagnosis not present

## 2022-06-05 DIAGNOSIS — E559 Vitamin D deficiency, unspecified: Secondary | ICD-10-CM | POA: Diagnosis not present

## 2022-06-05 DIAGNOSIS — N951 Menopausal and female climacteric states: Secondary | ICD-10-CM | POA: Diagnosis not present

## 2022-06-06 ENCOUNTER — Telehealth: Payer: Self-pay | Admitting: Physician Assistant

## 2022-06-06 DIAGNOSIS — M9904 Segmental and somatic dysfunction of sacral region: Secondary | ICD-10-CM | POA: Diagnosis not present

## 2022-06-06 DIAGNOSIS — M9903 Segmental and somatic dysfunction of lumbar region: Secondary | ICD-10-CM | POA: Diagnosis not present

## 2022-06-06 DIAGNOSIS — M9902 Segmental and somatic dysfunction of thoracic region: Secondary | ICD-10-CM | POA: Diagnosis not present

## 2022-06-06 DIAGNOSIS — M9901 Segmental and somatic dysfunction of cervical region: Secondary | ICD-10-CM | POA: Diagnosis not present

## 2022-06-06 NOTE — Telephone Encounter (Signed)
scheduled per 4/10 referral , pt has been called and confirmed date and time. Pt is aware of location and to arrive early for check in

## 2022-06-27 ENCOUNTER — Inpatient Hospital Stay (HOSPITAL_BASED_OUTPATIENT_CLINIC_OR_DEPARTMENT_OTHER): Payer: BC Managed Care – PPO | Admitting: Hematology

## 2022-06-27 ENCOUNTER — Inpatient Hospital Stay: Payer: BC Managed Care – PPO | Attending: Hematology

## 2022-06-27 ENCOUNTER — Encounter: Payer: Self-pay | Admitting: Hematology

## 2022-06-27 ENCOUNTER — Inpatient Hospital Stay: Payer: BC Managed Care – PPO

## 2022-06-27 VITALS — BP 120/90 | HR 83 | Temp 97.8°F | Resp 14 | Ht 67.0 in | Wt 194.6 lb

## 2022-06-27 DIAGNOSIS — Z90721 Acquired absence of ovaries, unilateral: Secondary | ICD-10-CM | POA: Insufficient documentation

## 2022-06-27 DIAGNOSIS — Z885 Allergy status to narcotic agent status: Secondary | ICD-10-CM | POA: Insufficient documentation

## 2022-06-27 DIAGNOSIS — Z8249 Family history of ischemic heart disease and other diseases of the circulatory system: Secondary | ICD-10-CM | POA: Insufficient documentation

## 2022-06-27 DIAGNOSIS — Z880 Allergy status to penicillin: Secondary | ICD-10-CM

## 2022-06-27 DIAGNOSIS — E0590 Thyrotoxicosis, unspecified without thyrotoxic crisis or storm: Secondary | ICD-10-CM | POA: Diagnosis not present

## 2022-06-27 DIAGNOSIS — Z7289 Other problems related to lifestyle: Secondary | ICD-10-CM | POA: Insufficient documentation

## 2022-06-27 DIAGNOSIS — R7982 Elevated C-reactive protein (CRP): Secondary | ICD-10-CM | POA: Insufficient documentation

## 2022-06-27 DIAGNOSIS — Z90710 Acquired absence of both cervix and uterus: Secondary | ICD-10-CM | POA: Diagnosis not present

## 2022-06-27 LAB — ANTITHROMBIN III: AntiThromb III Func: 119 % (ref 75–120)

## 2022-06-27 NOTE — Progress Notes (Signed)
[facility]   Telephone:([phone] Fax:([phone]   Clinic New Consult Note   Patient Care Team: Early, Sung Amabile, NP as PCP - General (Nurse Practitioner)  Date of Service:  06/27/2022   CHIEF COMPLAINTS/PURPOSE OF CONSULTATION:   Family history of blood clots  REFERRING PHYSICIAN:  Candice Camp ,MD  HISTORY OF PRESENTING ILLNESS:  Elizabeth Carpenter 62 y.o. female is a here because of family history of blood clots. The patient was referred by Candice Camp, MD. The patient presents to the clinic today alone.   Patient is postmenopausal, has vaginal dryness and stenosis.  Pt's Gynecologist Dr. Rana Snare has started her on Estrogen Patch, but she did not like the way she feels when she was on the patch, and stop it.  She has been on vaginal estrogen.  Her father had venous thrombosis in his 91s, after orthopedic surgery.  Patient has no personal history of thrombosis, or other family history of thrombosis.  She has no pregnancy loss.  Due to the concern of thrombosis from estrogen replacement, she was referred to [place] for evaluation.  She feels very well overall, denies any pain or other symptoms.  She has a PMHx of.... Hyperthyroidism    Socially... Married 2 children   REVIEW OF SYSTEMS:   Constitutional:(-)  Denies fevers, chills or abnormal night sweats Eyes: (-) Denies blurriness of vision, double vision or watery eyes Ears, nose, mouth, throat, and face: Denies mucositis or sore throat Respiratory:(-) Denies cough, dyspnea or wheezes Cardiovascular: (-) Denies palpitation, chest discomfort or lower extremity swelling Gastrointestinal: (-)  Denies nausea, heartburn or change in bowel habits Skin: Denies abnormal skin rashes Lymphatics:(-) Denies new lymphadenopathy or easy bruising Neurological:(-) Denies numbness, tingling or new weaknesses Behavioral/Psych:(-) Mood is stable, no new changes  All other systems were reviewed with the patient and are  negative.   MEDICAL HISTORY:  Past Medical History:  Diagnosis Date   Anemia    Fibroids    History of blood transfusion    Patient had a transfusion at birth [phone]   Seasonal allergies    SVD (spontaneous vaginal delivery)    x 2    SURGICAL HISTORY: Past Surgical History:  Procedure Laterality Date   BREAST SURGERY  2006   reduction   COLONOSCOPY     DILATION AND CURETTAGE OF UTERUS     HYSTERECTOMY ABDOMINAL WITH SALPINGO-OOPHORECTOMY Bilateral 02/27/2018   Procedure: possible HYSTERECTOMY ABDOMINAL WITH SALPINGO-OOPHORECTOMY;  Surgeon: Candice Camp, MD;  Location: [facility];  Service: Gynecology;  Laterality: Bilateral;   LAPAROSCOPIC VAGINAL HYSTERECTOMY WITH SALPINGO OOPHORECTOMY Bilateral 02/27/2018   Procedure: LAPAROSCOPIC ASSISTED VAGINAL HYSTERECTOMY WITH SALPINGO OOPHORECTOMY;  Surgeon: Candice Camp, MD;  Location: [facility];  Service: Gynecology;  Laterality: Bilateral;   UPPER GI ENDOSCOPY     WISDOM TOOTH EXTRACTION      SOCIAL HISTORY: Social History   Socioeconomic History   Marital status: Married    Spouse name: Not on file   Number of children: 2   Years of education: Not on file   Highest education level: Not on file  Occupational History   Not on file  Tobacco Use   Smoking status: Never   Smokeless tobacco: Never  Vaping Use   Vaping Use: Never used  Substance and Sexual Activity   Alcohol use: Yes    Alcohol/week: 3.0 - 5.0 standard drinks of alcohol    Types: 3 - 5 Glasses of wine per week   Drug use: Never  Sexual activity: Yes    Birth control/protection: Post-menopausal  Other Topics Concern   Not on file  Social History Narrative   Not on file   Social Determinants of Health   Financial Resource Strain: Not on file  Food Insecurity: Not on file  Transportation Needs: Not on file  Physical Activity: Not on file  Stress: Not on file  Social Connections: Not on file  Intimate Partner Violence: Not on file    FAMILY HISTORY: Family  History  Problem Relation Age of Onset   Thrombosis Mother    Thrombosis Father    Thrombosis Brother     ALLERGIES:  is allergic to morphine and related and amoxicillin.  MEDICATIONS:  Current Outpatient Medications  Medication Sig Dispense Refill   Calcium Carbonate 500 MG CHEW Chew by mouth.     conjugated estrogens (PREMARIN) vaginal cream      D 1000 25 MCG (1000 UT) capsule Take 1,000 Units by mouth daily.     fluticasone (FLONASE) 50 MCG/ACT nasal spray Place 1 spray into both nostrils daily. 16 g 11   loratadine (CLARITIN) 10 MG tablet Take 10 mg by mouth daily as needed for allergies.     methimazole (TAPAZOLE) 5 MG tablet TAKE 1/2 TABLET(2.5 MG) BY MOUTH DAILY 45 tablet 1   Multiple Vitamin (MULTIVITAMIN WITH MINERALS) TABS tablet Take 1 tablet by mouth daily.     Rhubarb (ESTROVEN COMPLETE) 4 MG TABS Take by mouth.     vitamin B-12 (CYANOCOBALAMIN) 1000 MCG tablet Take 1,000 mcg by mouth daily.     No current facility-administered medications for this visit.    PHYSICAL EXAMINATION: ECOG PERFORMANCE STATUS: 0 - Asymptomatic  Vitals:   06/27/22 1448  BP: (!) 120/90  Pulse: 83  Resp: 14  Temp: 97.8 F (36.6 C)  SpO2: 96%   Filed Weights   06/27/22 1448  Weight: 194 lb 9.6 oz (88.3 kg)    GENERAL:(-)alert, no distress and comfortable SKIN(-): skin color, texture, turgor are normal, no rashes or significant lesions EYES: (-) normal, Conjunctiva are pink and non-injected, sclera clear   LABORATORY DATA:  I have reviewed the data as listed    Latest Ref Rng & Units 10/27/2021    8:58 AM 07/31/2021   10:06 AM 07/28/2020   10:04 AM  CBC  WBC 4.0 - 10.5 K/uL 4.5  4.9  5.1   Hemoglobin 12.0 - 15.0 g/dL 01.0  27.2  53.6   Hematocrit 36.0 - 46.0 % 40.1  41.9  41.9   Platelets 150.0 - 400.0 K/uL 211.0  203  238        Latest Ref Rng & Units 10/27/2021    8:58 AM 07/31/2021   10:06 AM 07/28/2020   10:04 AM  CMP  Glucose 70 - 99 mg/dL 99  644  98   BUN 6 - 23 mg/dL  16  16  16    Creatinine 0.40 - 1.20 mg/dL 0.34  7.42  5.95   Sodium 135 - 145 mEq/L 143  141  140   Potassium 3.5 - 5.1 mEq/L 3.9  4.1  4.2   Chloride 96 - 112 mEq/L 107  104  104   CO2 19 - 32 mEq/L 28  23  27    Calcium 8.4 - 10.5 mg/dL 9.4  9.7  9.3   Total Protein 6.0 - 8.3 g/dL 7.3  6.9  7.4   Total Bilirubin 0.2 - 1.2 mg/dL 0.6  0.5  0.6   Alkaline  Phos 39 - 117 U/L 42  50  38   AST 0 - 37 U/L 19  20  16    ALT 0 - 35 U/L 26  31  23       RADIOGRAPHIC STUDIES: I have personally reviewed the radiological images as listed and agreed with the findings in the report. No results found.  ASSESSMENT & PLAN:  Elizabeth Carpenter is a 62 y.o.  female with a history of hyperthyroidism   Family history of thrombosis -Father had deep venous thrombosis after orthopedic surgery, patient had no history of thrombosis. -Started estrogen replacement for vaginal stenosis after menopause, which is a small risk for thrombosis. -Given her family history of thrombosis, I will obtain hypercoagulopathy workup today  -I do not have strong suspicion for thrombophilia or antiphospholipid syndrome. -I will call her with results.  If test is negative, she is cleared to have estrogen replacement.  We discussed the remote risk of breast cancer with estrogen replacement, and I encouraged her to have screening mammogram annually. -We also discussed the risk of thrombosis, such as smoking, overweight, sedentary life.  Preventive strategy discussed with her.   PLAN:  -recommend getting hypercoagulopathy workup -I will call her with results -I will see her back as needed  Orders Placed This Encounter  Procedures   Antithrombin III   Protein C activity   Protein C, total   Protein S activity   Protein S, total   Lupus anticoagulant panel   Beta-2-glycoprotein i abs, IgG/M/A   Homocysteine, serum   Factor 5 leiden   Prothrombin gene mutation   Cardiolipin antibodies, IgG, IgM, IgA    All questions were  answered. The patient knows to call the clinic with any problems, questions or concerns. The total time spent in the appointment was 30 minutes.     Malachy Mood, MD 06/27/2022 11:35 PM  I, Monica Martinez, am acting as scribe for Malachy Mood, MD.   I have reviewed the above documentation for accuracy and completeness, and I agree with the above.

## 2022-06-28 LAB — CARDIOLIPIN ANTIBODIES, IGG, IGM, IGA
Anticardiolipin IgA: <9 APL U/mL (ref 0–11)
Anticardiolipin IgG: <9 GPL U/mL (ref 0–14)
Anticardiolipin IgM: <9 MPL U/mL (ref 0–12)

## 2022-06-28 LAB — HOMOCYSTEINE: Homocysteine: 8 umol/L (ref 0.0–14.5)

## 2022-06-29 LAB — LUPUS ANTICOAGULANT PANEL
DRVVT: 35.6 s (ref 0.0–47.0)
PTT Lupus Anticoagulant: 28.2 s (ref 0.0–43.5)

## 2022-06-29 LAB — BETA-2-GLYCOPROTEIN I ABS, IGG/M/A
Beta-2 Glyco I IgG: <9 GPI IgG units (ref 0–20)
Beta-2-Glycoprotein I IgA: <9 GPI IgA units (ref 0–25)
Beta-2-Glycoprotein I IgM: <9 GPI IgM units (ref 0–32)

## 2022-06-29 LAB — PROTEIN S, TOTAL: Protein S Ag, Total: 99 % (ref 60–150)

## 2022-06-29 LAB — PROTEIN C, TOTAL: Protein C, Total: 202 % — ABNORMAL HIGH (ref 60–150)

## 2022-06-29 LAB — PROTEIN S ACTIVITY: Protein S Activity: 120 % (ref 63–140)

## 2022-06-29 LAB — PROTEIN C ACTIVITY: Protein C Activity: >199 % — ABNORMAL HIGH (ref 73–180)

## 2022-06-29 NOTE — Progress Notes (Signed)
Faxed Dr. Latanya Maudlin last office note to referring provider Dr. Dineen Kid. Rana Snare w/[facility] [phone]).  Fax confirmation received.

## 2022-07-12 LAB — PROTHROMBIN GENE MUTATION

## 2022-07-13 LAB — FACTOR 5 LEIDEN

## 2022-07-26 DIAGNOSIS — M9902 Segmental and somatic dysfunction of thoracic region: Secondary | ICD-10-CM | POA: Diagnosis not present

## 2022-07-26 DIAGNOSIS — M9904 Segmental and somatic dysfunction of sacral region: Secondary | ICD-10-CM | POA: Diagnosis not present

## 2022-07-26 DIAGNOSIS — M9903 Segmental and somatic dysfunction of lumbar region: Secondary | ICD-10-CM | POA: Diagnosis not present

## 2022-07-26 DIAGNOSIS — M9901 Segmental and somatic dysfunction of cervical region: Secondary | ICD-10-CM | POA: Diagnosis not present

## 2022-07-31 ENCOUNTER — Ambulatory Visit: Payer: BC Managed Care – PPO | Admitting: Internal Medicine

## 2022-07-31 ENCOUNTER — Encounter: Payer: Self-pay | Admitting: Internal Medicine

## 2022-07-31 VITALS — BP 118/74 | HR 72 | Ht 67.0 in | Wt 192.6 lb

## 2022-07-31 DIAGNOSIS — E042 Nontoxic multinodular goiter: Secondary | ICD-10-CM | POA: Diagnosis not present

## 2022-07-31 DIAGNOSIS — E0590 Thyrotoxicosis, unspecified without thyrotoxic crisis or storm: Secondary | ICD-10-CM

## 2022-07-31 LAB — TSH: TSH: 0.92 u[IU]/mL (ref 0.35–5.50)

## 2022-07-31 LAB — T4, FREE: Free T4: 0.74 ng/dL (ref 0.60–1.60)

## 2022-07-31 MED ORDER — METHIMAZOLE 5 MG PO TABS: 2.5000 mg | ORAL_TABLET | ORAL | 2 refills | Status: DC

## 2022-07-31 NOTE — Progress Notes (Signed)
Name: Elizabeth Carpenter  MRN/ DOB: [id number], 1960-12-23    Age/ Sex: 62 y.o., female    PCP: Tollie Eth, NP   Reason for Endocrinology Evaluation: Hyperthyroidism     Date of Initial Endocrinology Evaluation: 10/27/2021    HPI: [name] is a 62 y.o. female with unremarkable  past medical history . The patient presented for initial endocrinology clinic visit on 10/27/2021 for consultative assistance with her Hyperthyroidism.   Pt has been diagnosed with sub-clinical hyperthyroidism since 07/2020 with a TSH of 0.073 uIU/mL with normal T4, these results were confirmed in 07/2021    She was seen by [facility] Endocrinology  on 09/19/2020, Anti-TPO were normal at 3 IU/mL, TRAb was normal at <1.1 IU/L ( reference 0.00-1.75).  Uptake and scan on 11/11/2020 showed a focal area of photopenia noted at the left mid pole of the lobe,no other discrete hot or cold nodule identified. 24-Hr uptake was normal at 24.6%  A thyroid ultrasound on 12/12/2020 showed left mid to lower pole spongiform nodule with marked peripheral calcifications but no other nodules.   She is S/P FNA of the left nodule 12/12/2020 with a cytology report showing follicular lesion [facility] category III) . Thyroseq was negative per chart note   No XRT  Started methimazole 10/2021 with a TSH of 0.18 uIU/mL and fatigue  Mother and brother with thyroid disease   She is s/p benign FNA of the right superior 2.9 cm and left inferior 2.9 cm nodules 03/2022   SUBJECTIVE:    Today (07/31/22): Elizabeth Carpenter is here for follow-up on thyroid nodule and hyperthyroidism.    She was evaluated by oncology due to family history of clotting disorder 06/2022, protein C was elevated otherwise rest of the blood work was normal.  This has no clinical meaningful evidence  Weight has been trending down No local neck symptoms Denies palpitations or change in bowel movements Denies tremors  Methimazole 5 mg, half a tablet  daily    HISTORY:  Past Medical History:  Past Medical History:  Diagnosis Date   Anemia    Fibroids    History of blood transfusion    Patient had a transfusion at birth in1962   Seasonal allergies    SVD (spontaneous vaginal delivery)    x 2   Past Surgical History:  Past Surgical History:  Procedure Laterality Date   BREAST SURGERY  2006   reduction   COLONOSCOPY     DILATION AND CURETTAGE OF UTERUS     HYSTERECTOMY ABDOMINAL WITH SALPINGO-OOPHORECTOMY Bilateral 02/27/2018   Procedure: possible HYSTERECTOMY ABDOMINAL WITH SALPINGO-OOPHORECTOMY;  Surgeon: Candice Camp, MD;  Location: [facility];  Service: Gynecology;  Laterality: Bilateral;   LAPAROSCOPIC VAGINAL HYSTERECTOMY WITH SALPINGO OOPHORECTOMY Bilateral 02/27/2018   Procedure: LAPAROSCOPIC ASSISTED VAGINAL HYSTERECTOMY WITH SALPINGO OOPHORECTOMY;  Surgeon: Candice Camp, MD;  Location: [facility];  Service: Gynecology;  Laterality: Bilateral;   UPPER GI ENDOSCOPY     WISDOM TOOTH EXTRACTION      Social History:  reports that she has never smoked. She has never used smokeless tobacco. She reports current alcohol use of about 3.0 - 5.0 standard drinks of alcohol per week. She reports that she does not use drugs. Family History: family history includes Thrombosis in her brother, father, and mother.   HOME MEDICATIONS: Allergies as of 07/31/2022       Reactions   Morphine And Codeine Nausea And Vomiting   Amoxicillin Rash  Medication List        Accurate as of July 31, 2022  9:34 AM. If you have any questions, ask your nurse or doctor.          Calcium Carbonate 500 MG Chew Chew by mouth.   cyanocobalamin 1000 MCG tablet Commonly known as: VITAMIN B12 Take 1,000 mcg by mouth daily.   D 1000 25 MCG (1000 UT) capsule Generic drug: Cholecalciferol Take 1,000 Units by mouth daily.   Estroven Complete 4 MG Tabs Generic drug: Rhubarb Take by mouth.   fluticasone 50 MCG/ACT nasal spray Commonly known as:  FLONASE Place 1 spray into both nostrils daily.   loratadine 10 MG tablet Commonly known as: CLARITIN Take 10 mg by mouth daily as needed for allergies.   methimazole 5 MG tablet Commonly known as: TAPAZOLE TAKE 1/2 TABLET(2.5 MG) BY MOUTH DAILY   multivitamin with minerals Tabs tablet Take 1 tablet by mouth daily.   Premarin vaginal cream Generic drug: conjugated estrogens          REVIEW OF SYSTEMS: A comprehensive ROS was conducted with the patient and is negative except as per HPI   OBJECTIVE:  VS: BP 118/74 (BP Location: Left Arm, Patient Position: Sitting, Cuff Size: Small)   Pulse 72   Ht 5\' 7"  (1.702 m)   Wt 192 lb 9.6 oz (87.4 kg)   LMP  (LMP Unknown)   SpO2 98%   BMI 30.17 kg/m    Wt Readings from Last 3 Encounters:  07/31/22 192 lb 9.6 oz (87.4 kg)  06/27/22 194 lb 9.6 oz (88.3 kg)  03/16/22 194 lb 9.6 oz (88.3 kg)     EXAM: General: Pt appears well and is in NAD  Eyes: External eye exam normal without stare, lid lag or exophthalmos.  EOM intact.    Neck: General: Supple without adenopathy. Thyroid: Thyroid size normal.  Left  nodule appreciated.   Lungs: Clear with good BS bilat with no rales, rhonchi, or wheezes  Heart: Auscultation: RRR.  Abdomen: Soft, nontender  Extremities:  BL LE: No pretibial edema   Mental Status: Judgment, insight: Intact Orientation: Oriented to time, place, and person Mood and affect: No depression, anxiety, or agitation     DATA REVIEWED:   Latest Reference Range & Units 07/31/22 09:42  TSH 0.35 - 5.50 uIU/mL 0.92  T4,Free(Direct) 0.60 - 1.60 ng/dL 4.09     Latest Reference Range & Units 10/27/21 08:58  Sodium 135 - 145 mEq/L 143  Potassium 3.5 - 5.1 mEq/L 3.9  Chloride 96 - 112 mEq/L 107  CO2 19 - 32 mEq/L 28  Glucose 70 - 99 mg/dL 99  BUN 6 - 23 mg/dL 16  Creatinine 8.11 - 9.14 mg/dL 7.82  Calcium 8.4 - 95.6 mg/dL 9.4  Alkaline Phosphatase 39 - 117 U/L 42  Albumin 3.5 - 5.2 g/dL 4.4  AST 0 - 37 U/L  19  ALT 0 - 35 U/L 26  Total Protein 6.0 - 8.3 g/dL 7.3  Total Bilirubin 0.2 - 1.2 mg/dL 0.6  GFR >21.30 mL/min 67.36    Latest Reference Range & Units 10/27/21 08:58  WBC 4.0 - 10.5 K/uL 4.5  RBC 3.87 - 5.11 Mil/uL 4.51  Hemoglobin 12.0 - 15.0 g/dL 86.5  HCT 78.4 - 69.6 % 40.1  MCV 78.0 - 100.0 fl 89.0  MCHC 30.0 - 36.0 g/dL 29.5  RDW 28.4 - 13.2 % 13.3  Platelets 150.0 - 400.0 K/uL 211.0       FNA  left nodule 12/12/2020 Follicular lesion of undetermined significance ( Bethesda III)   FNA left inferior 2.9 cm nodule 04/23/2022   Clinical History: Nodule #7: Left; Inferior, Maximum size: 2.9 cm; Other  2 dimensions: 1.7 cm x 1.5 cm, mixed cystic and solid, isoechoic,  macrocalcifications, TI-RADS total points: 3.  Specimen Submitted:  A. THYROID, LT INFERIOR, FINE NEEDLE ASPIRATION:    FINAL MICROSCOPIC DIAGNOSIS:  - Consistent with benign follicular nodule (Bethesda category II)   FNA right superior 2.9 cm nodule 04/23/2022    Clinical History: Nodule #1: Right; Superior, Maximum size: 2.9 cm,  Other 2 dimensions: 1.3 cm x 1.2 cm, solid/almost completely solid,  isoechoic, macrocalcifications, TI-RADS total points: 4.  Specimen Submitted:  A. THYROID, RT SUPERIOR, FINE NEEDLE ASPIRATION:    FINAL MICROSCOPIC DIAGNOSIS:  - Consistent with benign follicular nodule (Bethesda category II)     Thyroid ULtrasound 03/21/2022  Estimated total number of nodules >/= 1 cm: 6-10   Number of spongiform nodules >/=  2 cm not described below (TR1): 0   Number of mixed cystic and solid nodules >/= 1.5 cm not described below (TR2): 0   _________________________________________________________   Nodule # 1:   Location: Right; Superior   Maximum size: 2.9 cm; Other 2 dimensions: 1.3 cm x 1.2 cm   Composition: solid/almost completely solid (2)   Echogenicity: isoechoic (1)   Shape: not taller-than-wide (0)   Margins: ill-defined (0)   Echogenic foci:  macrocalcifications (1)   ACR TI-RADS total points: 4.   ACR TI-RADS risk category: TR4 (4-6 points).   ACR TI-RADS recommendations:   Biopsy   _________________________________________________________   Nodule # 2:   Location: Right; Mid   Maximum size: 1.0 cm; Other 2 dimensions: 0.7 cm x 0.5 cm   Composition: solid/almost completely solid (2)   Echogenicity: isoechoic (1)   Shape: not taller-than-wide (0)   Margins: ill-defined (0)   Echogenic foci: none (0)   ACR TI-RADS total points: 3.   ACR TI-RADS risk category: TR3 (3 points).   ACR TI-RADS recommendations:   No surveillance   _________________________________________________________   Nodule # 3:   Location: Right; Mid   Maximum size: 0.9 cm; Other 2 dimensions: 0.6 cm x 0.5 cm   Composition: solid/almost completely solid (2)   Echogenicity: isoechoic (1)   Shape: not taller-than-wide (0)   Margins: ill-defined (0)   Echogenic foci: none (0)   ACR TI-RADS total points: 3.   ACR TI-RADS risk category: TR3 (3 points).   ACR TI-RADS recommendations:   No surveillance   _________________________________________________________   Nodule # 4:   Location: Right; Inferior   Maximum size: 1.4 cm; Other 2 dimensions: 1.3 cm x 0.8 cm   Composition: cannot determine (2)   Echogenicity: hypoechoic (2)   Shape: not taller-than-wide (0)   Margins: cannot determine (0)   Echogenic foci: macrocalcifications (1)   ACR TI-RADS total points: 5.   ACR TI-RADS risk category: TR4 (4-6 points).   ACR TI-RADS recommendations:   Surveillance   _________________________________________________________   Nodule # 5:   Location: Left; Superior   Maximum size: 1.7 cm; Other 2 dimensions: 1.3 cm x 1.2 cm   Composition: solid/almost completely solid (2)   Echogenicity: isoechoic (1)   Shape: not taller-than-wide (0)   Margins: ill-defined (0)   Echogenic foci: none (0)   ACR TI-RADS  total points: 3.   ACR TI-RADS risk category: TR3 (3 points).   ACR TI-RADS recommendations:   Surveillance  _________________________________________________________   Nodule # 6:   Location: Left; Mid   Maximum size: 1.49 cm; Other 2 dimensions: 1.3 cm x 0.8 cm   Composition: cannot determine (2)   Echogenicity: isoechoic (1)   Shape: not taller-than-wide (0)   Margins: ill-defined (0)   Echogenic foci: punctate echogenic foci (3)   ACR TI-RADS total points: 6.   ACR TI-RADS risk category: TR4 (4-6 points).   ACR TI-RADS recommendations:   Surveillance   _________________________________________________________   Nodule # 7:   Location: Left; Inferior   Maximum size: 2.9 cm; Other 2 dimensions: 1.7 cm x 1.5 cm   Composition: mixed cystic and solid (1)   Echogenicity: isoechoic (1)   Shape: not taller-than-wide (0)   Margins: ill-defined (0)   Echogenic foci: macrocalcifications (1)   ACR TI-RADS total points: 3.   ACR TI-RADS risk category: TR3 (3 points).   ACR TI-RADS recommendations:   Biopsy   _________________________________________________________   No adenopathy   IMPRESSION: Multinodular thyroid.   Right superior thyroid nodule (labeled 1, 2.9 cm), and the left inferior thyroid nodule (labeled 7, 2.9 cm) both meet criteria for biopsy, as designated by the newly established ACR TI-RADS criteria, and referral for biopsy is recommended.   Right inferior thyroid nodule (labeled 4), left superior thyroid nodule (labeled 5), and the left mid thyroid nodule (labeled 6) meet criteria for surveillance, as designated by the newly established ACR TI-RADS criteria. Surveillance ultrasound study recommended to be performed annually up to 5 years.     Thyroid uptake and scan 11/11/2020 FINDINGS:   Thyroid: Focal area of photopenia noted at the mid pole of the left thyroid lobe corresponding to a hypodense thyroid nodule. No discrete cold  or hot nodule identified in the right thyroid lobe.   Background activity: Normal.   Uptake at 24 hours is 24.6%, with upper limits of normal 30%.       ASSESSMENT/PLAN/RECOMMENDATIONS:   Subclinical hyperthyroidism :   - D/D of Graves' disease vs autonomous thyroid nodule , given FH of thyroid disease she is at high risk for Graves' disease.  -We briefly discussed radioactive iodine ablation, we discussed the possibility of the need for lifelong LT-4 replacement -TFTs remain within normal range, but will decrease methimazole as below   Medications : Decrease methimazole 5 mg, half a tablet Monday through Saturday, none on Sundays    2. MNG:  - Thyroid uptake and scan showed a " COLD" left thyroid nodule with 24% uptake at 24 hrs.  - She is S/P FNA of the left dominant nodule with follicular lesion of undetermined significance (Bethesda Category III), per chart through care everywhere, thyroseq was benign  -She is s/p benign FNA of the right and the left thyroid nodules 03/2022     F/U in 6 months    Signed electronically by: Lyndle Herrlich, MD  [facility] Endocrinology  [facility] [address] [place] [place], [place] [address] Phone: [phone] FAX: [phone]   CC: Tollie Eth, NP [address] [place] [address] Phone: [phone] Fax: [phone]   Return to Endocrinology clinic as below: No future appointments.

## 2022-08-01 LAB — T3: T3, Total: 85 ng/dL (ref 76–181)

## 2022-09-18 DIAGNOSIS — E559 Vitamin D deficiency, unspecified: Secondary | ICD-10-CM | POA: Diagnosis not present

## 2022-09-18 DIAGNOSIS — E612 Magnesium deficiency: Secondary | ICD-10-CM | POA: Diagnosis not present

## 2022-09-18 DIAGNOSIS — G4700 Insomnia, unspecified: Secondary | ICD-10-CM | POA: Diagnosis not present

## 2022-09-18 DIAGNOSIS — N951 Menopausal and female climacteric states: Secondary | ICD-10-CM | POA: Diagnosis not present

## 2022-09-28 DIAGNOSIS — M9901 Segmental and somatic dysfunction of cervical region: Secondary | ICD-10-CM | POA: Diagnosis not present

## 2022-09-28 DIAGNOSIS — M9904 Segmental and somatic dysfunction of sacral region: Secondary | ICD-10-CM | POA: Diagnosis not present

## 2022-09-28 DIAGNOSIS — M9903 Segmental and somatic dysfunction of lumbar region: Secondary | ICD-10-CM | POA: Diagnosis not present

## 2022-09-28 DIAGNOSIS — M9902 Segmental and somatic dysfunction of thoracic region: Secondary | ICD-10-CM | POA: Diagnosis not present

## 2023-01-31 ENCOUNTER — Ambulatory Visit: Payer: BC Managed Care – PPO | Admitting: Internal Medicine

## 2023-03-11 ENCOUNTER — Ambulatory Visit (INDEPENDENT_AMBULATORY_CARE_PROVIDER_SITE_OTHER): Payer: BC Managed Care – PPO | Admitting: Internal Medicine

## 2023-03-11 VITALS — BP 120/80 | HR 67 | Ht 67.0 in | Wt 194.0 lb

## 2023-03-11 DIAGNOSIS — E042 Nontoxic multinodular goiter: Secondary | ICD-10-CM

## 2023-03-11 DIAGNOSIS — E0590 Thyrotoxicosis, unspecified without thyrotoxic crisis or storm: Secondary | ICD-10-CM | POA: Diagnosis not present

## 2023-03-11 NOTE — Progress Notes (Signed)
 Name: Elizabeth Carpenter  MRN/ DOB: [id number], [birth year]    Age/ Sex: 64 y.o., female    PCP: Oris Camie BRAVO, NP   Reason for Endocrinology Evaluation: Hyperthyroidism     Date of Initial Endocrinology Evaluation: 10/27/2021    HPI: [name] is a 63 y.o. female with unremarkable  past medical history . The patient presented for initial endocrinology clinic visit on 10/27/2021 for consultative assistance with her Hyperthyroidism.   Pt has been diagnosed with sub-clinical hyperthyroidism since 07/2020 with a TSH of 0.073 uIU/mL with normal T4, these results were confirmed in 07/2021    She was seen by [facility]  on 09/19/2020, Anti-TPO were normal at 3 IU/mL, TRAb was normal at <1.1 IU/L ( reference 0.00-1.75).  Uptake and scan on 11/11/2020 showed a focal area of photopenia noted at the left mid pole of the lobe,no other discrete hot or cold nodule identified. 24-Hr uptake was normal at 24.6%  A thyroid  ultrasound on 12/12/2020 showed left mid to lower pole spongiform nodule with marked peripheral calcifications but no other nodules.   She is S/P FNA of the left nodule 12/12/2020 with a cytology report showing follicular lesion [facility] category III) . Thyroseq was negative per chart note   No XRT  Started methimazole  10/2021 with a TSH of 0.18 uIU/mL and fatigue  Mother and brother with thyroid  disease   She is s/p benign FNA of the right superior 2.9 cm and left inferior 2.9 cm nodules 03/2022   SUBJECTIVE:    Today (03/11/23): Elizabeth Carpenter is here for follow-up on thyroid  nodule and hyperthyroidism.   Weight has been stable No local neck symptoms Denies palpitations  Denies change in bowel movements Denies tremors Denies anxiety or jittery sensation  Feels tired this past week  Methimazole  5 mg, half a tablet Monday through Saturday, none on Sundays    HISTORY:  Past Medical History:  Past Medical History:  Diagnosis Date   Anemia     Fibroids    History of blood transfusion    Patient had a transfusion at birth in1962   Seasonal allergies    SVD (spontaneous vaginal delivery)    x 2   Past Surgical History:  Past Surgical History:  Procedure Laterality Date   BREAST SURGERY  2006   reduction   COLONOSCOPY     DILATION AND CURETTAGE OF UTERUS     HYSTERECTOMY ABDOMINAL WITH SALPINGO-OOPHORECTOMY Bilateral 02/27/2018   Procedure: possible HYSTERECTOMY ABDOMINAL WITH SALPINGO-OOPHORECTOMY;  Surgeon: Marget Lenis, MD;  Location: [facility];  Service: Gynecology;  Laterality: Bilateral;   LAPAROSCOPIC VAGINAL HYSTERECTOMY WITH SALPINGO OOPHORECTOMY Bilateral 02/27/2018   Procedure: LAPAROSCOPIC ASSISTED VAGINAL HYSTERECTOMY WITH SALPINGO OOPHORECTOMY;  Surgeon: Marget Lenis, MD;  Location: [facility];  Service: Gynecology;  Laterality: Bilateral;   UPPER GI ENDOSCOPY     WISDOM TOOTH EXTRACTION      Social History:  reports that she has never smoked. She has never used smokeless tobacco. She reports current alcohol use of about 3.0 - 5.0 standard drinks of alcohol per week. She reports that she does not use drugs. Family History: family history includes Thrombosis in her brother, father, and mother.   HOME MEDICATIONS: Allergies as of 03/11/2023       Reactions   Morphine And Codeine Nausea And Vomiting   Amoxicillin Rash        Medication List        Accurate as of March 11, 2023  9:15 AM.  If you have any questions, ask your nurse or doctor.          Calcium Carbonate 500 MG Chew Chew by mouth.   cyanocobalamin  1000 MCG tablet Commonly known as: VITAMIN B12 Take 1,000 mcg by mouth daily.   D 1000 25 MCG (1000 UT) capsule Generic drug: Cholecalciferol Take 1,000 Units by mouth daily.   Estroven Complete 4 MG Tabs Generic drug: Rhubarb Take by mouth.   fluticasone  50 MCG/ACT nasal spray Commonly known as: FLONASE  Place 1 spray into both nostrils daily.   loratadine 10 MG tablet Commonly known as:  CLARITIN Take 10 mg by mouth daily as needed for allergies.   methimazole  5 MG tablet Commonly known as: TAPAZOLE  Take 0.5 tablets (2.5 mg total) by mouth as directed. Half a tablet Monday through Saturday and none on Sundays   multivitamin with minerals Tabs tablet Take 1 tablet by mouth daily.   Premarin vaginal cream Generic drug: conjugated estrogens          REVIEW OF SYSTEMS: A comprehensive ROS was conducted with the patient and is negative except as per HPI   OBJECTIVE:  VS: BP 120/80 (BP Location: Left Arm, Patient Position: Sitting, Cuff Size: Normal)   Pulse 67   Ht 5' 7 (1.702 m)   Wt 194 lb (88 kg)   LMP  (LMP Unknown)   SpO2 97%   BMI 30.38 kg/m    Wt Readings from Last 3 Encounters:  03/11/23 194 lb (88 kg)  07/31/22 192 lb 9.6 oz (87.4 kg)  06/27/22 194 lb 9.6 oz (88.3 kg)     EXAM: General: Pt appears well and is in NAD  Neck: General: Supple without adenopathy. Thyroid : Bilateral nodules appreciated  Lungs: Clear with good BS bilat   Heart: Auscultation: RRR.  Abdomen: Soft, nontender  Extremities:  BL LE: No pretibial edema   Mental Status: Judgment, insight: Intact Orientation: Oriented to time, place, and person Mood and affect: No depression, anxiety, or agitation     DATA REVIEWED:   Latest Reference Range & Units 03/11/23 09:23  TSH 0.40 - 4.50 mIU/L 1.15  Triiodothyronine,Free,Serum 2.3 - 4.2 pg/mL 3.9  T4,Free(Direct) 0.8 - 1.8 ng/dL 1.1     Latest Reference Range & Units 10/27/21 08:58  Sodium 135 - 145 mEq/L 143  Potassium 3.5 - 5.1 mEq/L 3.9  Chloride 96 - 112 mEq/L 107  CO2 19 - 32 mEq/L 28  Glucose 70 - 99 mg/dL 99  BUN 6 - 23 mg/dL 16  Creatinine 9.59 - 8.79 mg/dL 9.06  Calcium 8.4 - 89.4 mg/dL 9.4  Alkaline Phosphatase 39 - 117 U/L 42  Albumin 3.5 - 5.2 g/dL 4.4  AST 0 - 37 U/L 19  ALT 0 - 35 U/L 26  Total Protein 6.0 - 8.3 g/dL 7.3  Total Bilirubin 0.2 - 1.2 mg/dL 0.6  GFR >39.99 mL/min 67.36    Latest  Reference Range & Units 10/27/21 08:58  WBC 4.0 - 10.5 K/uL 4.5  RBC 3.87 - 5.11 Mil/uL 4.51  Hemoglobin 12.0 - 15.0 g/dL 86.4  HCT 63.9 - 53.9 % 40.1  MCV 78.0 - 100.0 fl 89.0  MCHC 30.0 - 36.0 g/dL 66.2  RDW 88.4 - 84.4 % 13.3  Platelets 150.0 - 400.0 K/uL 211.0       FNA left nodule 12/12/2020 Follicular lesion of undetermined significance ( Bethesda III)   FNA left inferior 2.9 cm nodule 04/23/2022   Clinical History: Nodule #7: Left; Inferior, Maximum size:  2.9 cm; Other  2 dimensions: 1.7 cm x 1.5 cm, mixed cystic and solid, isoechoic,  macrocalcifications, TI-RADS total points: 3.  Specimen Submitted:  A. THYROID , LT INFERIOR, FINE NEEDLE ASPIRATION:    FINAL MICROSCOPIC DIAGNOSIS:  - Consistent with benign follicular nodule (Bethesda category II)   FNA right superior 2.9 cm nodule 04/23/2022    Clinical History: Nodule #1: Right; Superior, Maximum size: 2.9 cm,  Other 2 dimensions: 1.3 cm x 1.2 cm, solid/almost completely solid,  isoechoic, macrocalcifications, TI-RADS total points: 4.  Specimen Submitted:  A. THYROID , RT SUPERIOR, FINE NEEDLE ASPIRATION:    FINAL MICROSCOPIC DIAGNOSIS:  - Consistent with benign follicular nodule (Bethesda category II)     Thyroid  ULtrasound 03/21/2022  Estimated total number of nodules >/= 1 cm: 6-10   Number of spongiform nodules >/=  2 cm not described below (TR1): 0   Number of mixed cystic and solid nodules >/= 1.5 cm not described below (TR2): 0   _________________________________________________________   Nodule # 1:   Location: Right; Superior   Maximum size: 2.9 cm; Other 2 dimensions: 1.3 cm x 1.2 cm   Composition: solid/almost completely solid (2)   Echogenicity: isoechoic (1)   Shape: not taller-than-wide (0)   Margins: ill-defined (0)   Echogenic foci: macrocalcifications (1)   ACR TI-RADS total points: 4.   ACR TI-RADS risk category: TR4 (4-6 points).   ACR TI-RADS recommendations:    Biopsy   _________________________________________________________   Nodule # 2:   Location: Right; Mid   Maximum size: 1.0 cm; Other 2 dimensions: 0.7 cm x 0.5 cm   Composition: solid/almost completely solid (2)   Echogenicity: isoechoic (1)   Shape: not taller-than-wide (0)   Margins: ill-defined (0)   Echogenic foci: none (0)   ACR TI-RADS total points: 3.   ACR TI-RADS risk category: TR3 (3 points).   ACR TI-RADS recommendations:   No surveillance   _________________________________________________________   Nodule # 3:   Location: Right; Mid   Maximum size: 0.9 cm; Other 2 dimensions: 0.6 cm x 0.5 cm   Composition: solid/almost completely solid (2)   Echogenicity: isoechoic (1)   Shape: not taller-than-wide (0)   Margins: ill-defined (0)   Echogenic foci: none (0)   ACR TI-RADS total points: 3.   ACR TI-RADS risk category: TR3 (3 points).   ACR TI-RADS recommendations:   No surveillance   _________________________________________________________   Nodule # 4:   Location: Right; Inferior   Maximum size: 1.4 cm; Other 2 dimensions: 1.3 cm x 0.8 cm   Composition: cannot determine (2)   Echogenicity: hypoechoic (2)   Shape: not taller-than-wide (0)   Margins: cannot determine (0)   Echogenic foci: macrocalcifications (1)   ACR TI-RADS total points: 5.   ACR TI-RADS risk category: TR4 (4-6 points).   ACR TI-RADS recommendations:   Surveillance   _________________________________________________________   Nodule # 5:   Location: Left; Superior   Maximum size: 1.7 cm; Other 2 dimensions: 1.3 cm x 1.2 cm   Composition: solid/almost completely solid (2)   Echogenicity: isoechoic (1)   Shape: not taller-than-wide (0)   Margins: ill-defined (0)   Echogenic foci: none (0)   ACR TI-RADS total points: 3.   ACR TI-RADS risk category: TR3 (3 points).   ACR TI-RADS recommendations:   Surveillance    _________________________________________________________   Nodule # 6:   Location: Left; Mid   Maximum size: 1.49 cm; Other 2 dimensions: 1.3 cm x 0.8 cm   Composition:  cannot determine (2)   Echogenicity: isoechoic (1)   Shape: not taller-than-wide (0)   Margins: ill-defined (0)   Echogenic foci: punctate echogenic foci (3)   ACR TI-RADS total points: 6.   ACR TI-RADS risk category: TR4 (4-6 points).   ACR TI-RADS recommendations:   Surveillance   _________________________________________________________   Nodule # 7:   Location: Left; Inferior   Maximum size: 2.9 cm; Other 2 dimensions: 1.7 cm x 1.5 cm   Composition: mixed cystic and solid (1)   Echogenicity: isoechoic (1)   Shape: not taller-than-wide (0)   Margins: ill-defined (0)   Echogenic foci: macrocalcifications (1)   ACR TI-RADS total points: 3.   ACR TI-RADS risk category: TR3 (3 points).   ACR TI-RADS recommendations:   Biopsy   _________________________________________________________   No adenopathy   IMPRESSION: Multinodular thyroid .   Right superior thyroid  nodule (labeled 1, 2.9 cm), and the left inferior thyroid  nodule (labeled 7, 2.9 cm) both meet criteria for biopsy, as designated by the newly established ACR TI-RADS criteria, and referral for biopsy is recommended.   Right inferior thyroid  nodule (labeled 4), left superior thyroid  nodule (labeled 5), and the left mid thyroid  nodule (labeled 6) meet criteria for surveillance, as designated by the newly established ACR TI-RADS criteria. Surveillance ultrasound study recommended to be performed annually up to 5 years.     Thyroid  uptake and scan 11/11/2020 FINDINGS:   Thyroid : Focal area of photopenia noted at the mid pole of the left thyroid  lobe corresponding to a hypodense thyroid  nodule. No discrete cold or hot nodule identified in the right thyroid  lobe.   Background activity: Normal.   Uptake at 24 hours is  24.6%, with upper limits of normal 30%.       ASSESSMENT/PLAN/RECOMMENDATIONS:   Subclinical hyperthyroidism :   - D/D of Graves' disease vs autonomous thyroid  nodule , given FH of thyroid  disease she is at high risk for Graves' disease.  -We briefly discussed radioactive iodine ablation, we discussed the possibility of the need for lifelong LT-4 replacement today we also discussed radiofrequency ablation as an option if needed in the future -TFTs are normal , will decrease methimazole  as below    Medications : Decrease methimazole  5 mg, half a tablet Monday through Friday, none on Saturday and Sundays    2. MNG:  - Thyroid  uptake and scan showed a  COLD left thyroid  nodule with 24% uptake at 24 hrs.  - She is S/P FNA of the left dominant nodule with follicular lesion of undetermined significance (Bethesda Category III), per chart through care everywhere, thyroseq was benign  -She is s/p benign FNA of the right and the left thyroid  nodules 03/2022 - Will proceed with repeat ultrasound this year      F/U in 6 months    Signed electronically by: Stefano Redgie Butts, MD  [facility] End[facility]  [facility] [address] [place] [place], [place] [address] Phone: [phone] FAX: [phone]   CC: Oris Camie BRAVO, NP [address] [place] [address] Phone: [phone] Fax: [phone]   Return to Endocrinology clinic as below: No future appointments.

## 2023-03-12 DIAGNOSIS — Z01419 Encounter for gynecological examination (general) (routine) without abnormal findings: Secondary | ICD-10-CM | POA: Diagnosis not present

## 2023-03-12 DIAGNOSIS — Z1272 Encounter for screening for malignant neoplasm of vagina: Secondary | ICD-10-CM | POA: Diagnosis not present

## 2023-03-12 DIAGNOSIS — Z1231 Encounter for screening mammogram for malignant neoplasm of breast: Secondary | ICD-10-CM | POA: Diagnosis not present

## 2023-03-12 DIAGNOSIS — Z6831 Body mass index (BMI) 31.0-31.9, adult: Secondary | ICD-10-CM | POA: Diagnosis not present

## 2023-03-12 LAB — TSH: TSH: 1.15 m[IU]/L (ref 0.40–4.50)

## 2023-03-12 LAB — HM MAMMOGRAPHY

## 2023-03-12 LAB — T3, FREE: T3, Free: 3.9 pg/mL (ref 2.3–4.2)

## 2023-03-12 LAB — T4, FREE: Free T4: 1.1 ng/dL (ref 0.8–1.8)

## 2023-03-12 MED ORDER — METHIMAZOLE 5 MG PO TABS: 2.5000 mg | ORAL_TABLET | ORAL | 3 refills | Status: DC

## 2023-03-14 ENCOUNTER — Ambulatory Visit
Admission: RE | Admit: 2023-03-14 | Discharge: 2023-03-14 | Disposition: A | Discharge status: Home / Self Care | Payer: BC Managed Care – PPO | Source: Ambulatory Visit | Attending: Internal Medicine | Admitting: Internal Medicine

## 2023-03-14 DIAGNOSIS — E042 Nontoxic multinodular goiter: Secondary | ICD-10-CM | POA: Diagnosis not present

## 2023-03-19 ENCOUNTER — Telehealth: Payer: Self-pay | Admitting: Internal Medicine

## 2023-03-19 DIAGNOSIS — E042 Nontoxic multinodular goiter: Secondary | ICD-10-CM

## 2023-03-19 NOTE — Telephone Encounter (Signed)
Discussed thyroid ultrasound results with the patient on 03/19/2023 at 1230    Right inferior, nodule #4, 1.8 cm in diameter meet FNA criteria, patient in agreement to proceed with this   An order has been placed

## 2023-04-22 ENCOUNTER — Inpatient Hospital Stay: Admission: RE | Admit: 2023-04-22 | Payer: BC Managed Care – PPO | Source: Ambulatory Visit

## 2023-04-25 DIAGNOSIS — Z03818 Encounter for observation for suspected exposure to other biological agents ruled out: Secondary | ICD-10-CM | POA: Diagnosis not present

## 2023-04-25 DIAGNOSIS — J101 Influenza due to other identified influenza virus with other respiratory manifestations: Secondary | ICD-10-CM | POA: Diagnosis not present

## 2023-04-25 DIAGNOSIS — R051 Acute cough: Secondary | ICD-10-CM | POA: Diagnosis not present

## 2023-05-22 ENCOUNTER — Ambulatory Visit
Admission: RE | Admit: 2023-05-22 | Discharge: 2023-05-22 | Disposition: A | Discharge status: Home / Self Care | Payer: BC Managed Care – PPO | Source: Ambulatory Visit | Attending: Internal Medicine | Admitting: Internal Medicine

## 2023-05-22 ENCOUNTER — Other Ambulatory Visit (HOSPITAL_COMMUNITY)
Admission: RE | Admit: 2023-05-22 | Discharge: 2023-05-22 | Disposition: A | Discharge status: Home / Self Care | Source: Ambulatory Visit | Attending: Internal Medicine | Admitting: Internal Medicine

## 2023-05-22 DIAGNOSIS — E042 Nontoxic multinodular goiter: Secondary | ICD-10-CM | POA: Insufficient documentation

## 2023-05-22 DIAGNOSIS — E041 Nontoxic single thyroid nodule: Secondary | ICD-10-CM | POA: Diagnosis not present

## 2023-05-24 ENCOUNTER — Encounter: Payer: Self-pay | Admitting: Internal Medicine

## 2023-05-24 LAB — CYTOLOGY - NON PAP

## 2023-08-13 DIAGNOSIS — S30861A Insect bite (nonvenomous) of abdominal wall, initial encounter: Secondary | ICD-10-CM | POA: Diagnosis not present

## 2023-09-16 ENCOUNTER — Ambulatory Visit: Payer: BC Managed Care – PPO | Admitting: Internal Medicine

## 2023-09-24 DIAGNOSIS — Z713 Dietary counseling and surveillance: Secondary | ICD-10-CM | POA: Diagnosis not present

## 2023-09-24 DIAGNOSIS — Z6831 Body mass index (BMI) 31.0-31.9, adult: Secondary | ICD-10-CM | POA: Diagnosis not present

## 2023-09-27 ENCOUNTER — Ambulatory Visit: Admitting: Internal Medicine

## 2023-09-27 ENCOUNTER — Encounter: Payer: Self-pay | Admitting: Internal Medicine

## 2023-09-27 VITALS — BP 124/70 | HR 64 | Ht 67.0 in | Wt 194.0 lb

## 2023-09-27 DIAGNOSIS — E042 Nontoxic multinodular goiter: Secondary | ICD-10-CM | POA: Diagnosis not present

## 2023-09-27 DIAGNOSIS — E0590 Thyrotoxicosis, unspecified without thyrotoxic crisis or storm: Secondary | ICD-10-CM

## 2023-09-27 LAB — TSH: TSH: 1.09 m[IU]/L (ref 0.40–4.50)

## 2023-09-27 LAB — T4, FREE: Free T4: 1 ng/dL (ref 0.8–1.8)

## 2023-09-27 NOTE — Progress Notes (Signed)
 Name: Elizabeth Carpenter  MRN/ DOB: [id number], May 20, 1960    Age/ Sex: 63 y.o., female    PCP: Oris Camie BRAVO, NP   Reason for Endocrinology Evaluation: Hyperthyroidism     Date of Initial Endocrinology Evaluation: 10/27/2021    HPI: [name] is a 63 y.o. female with unremarkable  past medical history . The patient presented for initial endocrinology clinic visit on 10/27/2021 for consultative assistance with her Hyperthyroidism.   Pt has been diagnosed with sub-clinical hyperthyroidism since 07/2020 with a TSH of 0.073 uIU/mL with normal T4, these results were confirmed in 07/2021    She was seen by [facility]  on 09/19/2020, Anti-TPO were normal at 3 IU/mL, TRAb was normal at <1.1 IU/L ( reference 0.00-1.75).  Uptake and scan on 11/11/2020 showed a focal area of photopenia noted at the left mid pole of the lobe,no other discrete hot or cold nodule identified. 24-Hr uptake was normal at 24.6%   A thyroid  ultrasound on 12/12/2020 showed left mid to lower pole spongiform nodule with marked peripheral calcifications but no other nodules.   She is S/P FNA of the left nodule 12/12/2020 with a cytology report showing follicular lesion [facility] category III) . Thyroseq was negative per chart note   No XRT  Started methimazole  10/2021 with a TSH of 0.18 uIU/mL and fatigue  Mother and brother with thyroid  disease   She is s/p benign FNA of the right superior 2.9 cm and left inferior 2.9 cm nodules 03/2022  She is s/p benign FNA of enlarging right inferior 1.8 cm nodule 04/2023   SUBJECTIVE:    Today (09/27/23): Elizabeth Carpenter is here for follow-up on thyroid  nodule and hyperthyroidism.   Weight remains stable She will start seeing a nutritionist this week  No local neck swelling  Denies  palpitations  Denies tremors  Has noted constipation with starting a new supplements   Methimazole  5 mg, half a tablet Monday through Friday , none on Saturdays or  Sundays    HISTORY:  Past Medical History:  Past Medical History:  Diagnosis Date   Anemia    Fibroids    History of blood transfusion    Patient had a transfusion at birth in1962   Seasonal allergies    SVD (spontaneous vaginal delivery)    x 2   Past Surgical History:  Past Surgical History:  Procedure Laterality Date   BREAST SURGERY  2006   reduction   COLONOSCOPY     DILATION AND CURETTAGE OF UTERUS     HYSTERECTOMY ABDOMINAL WITH SALPINGO-OOPHORECTOMY Bilateral 02/27/2018   Procedure: possible HYSTERECTOMY ABDOMINAL WITH SALPINGO-OOPHORECTOMY;  Surgeon: Marget Lenis, MD;  Location: [facility];  Service: Gynecology;  Laterality: Bilateral;   LAPAROSCOPIC VAGINAL HYSTERECTOMY WITH SALPINGO OOPHORECTOMY Bilateral 02/27/2018   Procedure: LAPAROSCOPIC ASSISTED VAGINAL HYSTERECTOMY WITH SALPINGO OOPHORECTOMY;  Surgeon: Marget Lenis, MD;  Location: [facility];  Service: Gynecology;  Laterality: Bilateral;   UPPER GI ENDOSCOPY     WISDOM TOOTH EXTRACTION      Social History:  reports that she has never smoked. She has never used smokeless tobacco. She reports current alcohol use of about 3.0 - 5.0 standard drinks of alcohol per week. She reports that she does not use drugs. Family History: family history includes Thrombosis in her brother, father, and mother.   HOME MEDICATIONS: Allergies as of 09/27/2023       Reactions   Morphine And Codeine Nausea And Vomiting   Amoxicillin Rash  Medication List        Accurate as of September 27, 2023  9:38 AM. If you have any questions, ask your nurse or doctor.          Calcium Carbonate 500 MG Chew Chew by mouth.   cyanocobalamin  1000 MCG tablet Commonly known as: VITAMIN B12 Take 1,000 mcg by mouth daily.   D 1000 25 MCG (1000 UT) capsule Generic drug: Cholecalciferol Take 1,000 Units by mouth daily.   Estroven Complete 4 MG Tabs Generic drug: Rhubarb Take by mouth.   fluticasone  50 MCG/ACT nasal spray Commonly known as:  FLONASE  Place 1 spray into both nostrils daily.   loratadine 10 MG tablet Commonly known as: CLARITIN Take 10 mg by mouth daily as needed for allergies.   methimazole  5 MG tablet Commonly known as: TAPAZOLE  Take 0.5 tablets (2.5 mg total) by mouth as directed. Half a tablet Monday through Friday only   multivitamin with minerals Tabs tablet Take 1 tablet by mouth daily.   Premarin vaginal cream Generic drug: conjugated estrogens          REVIEW OF SYSTEMS: A comprehensive ROS was conducted with the patient and is negative except as per HPI   OBJECTIVE:  VS: BP 124/70 (BP Location: Left Arm, Patient Position: Sitting, Cuff Size: Normal)   Pulse 64   Ht 5' 7 (1.702 m)   Wt 194 lb (88 kg)   LMP  (LMP Unknown)   SpO2 98%   BMI 30.38 kg/m    Wt Readings from Last 3 Encounters:  09/27/23 194 lb (88 kg)  03/11/23 194 lb (88 kg)  07/31/22 192 lb 9.6 oz (87.4 kg)     EXAM: General: Pt appears well and is in NAD  Neck: General: Supple without adenopathy. Thyroid : Bilateral nodules appreciated  Lungs: Clear with good BS bilat   Heart: Auscultation: RRR.  Abdomen: Soft, nontender  Extremities:  BL LE: No pretibial edema   Mental Status: Judgment, insight: Intact Orientation: Oriented to time, place, and person Mood and affect: No depression, anxiety, or agitation     DATA REVIEWED:   Latest Reference Range & Units 09/27/23 09:57  TSH 0.40 - 4.50 mIU/L 1.09  T4,Free(Direct) 0.8 - 1.8 ng/dL 1.0    Latest Reference Range & Units 10/27/21 08:58  Sodium 135 - 145 mEq/L 143  Potassium 3.5 - 5.1 mEq/L 3.9  Chloride 96 - 112 mEq/L 107  CO2 19 - 32 mEq/L 28  Glucose 70 - 99 mg/dL 99  BUN 6 - 23 mg/dL 16  Creatinine 9.59 - 8.79 mg/dL 9.06  Calcium 8.4 - 89.4 mg/dL 9.4  Alkaline Phosphatase 39 - 117 U/L 42  Albumin 3.5 - 5.2 g/dL 4.4  AST 0 - 37 U/L 19  ALT 0 - 35 U/L 26  Total Protein 6.0 - 8.3 g/dL 7.3  Total Bilirubin 0.2 - 1.2 mg/dL 0.6  GFR >39.99 mL/min  67.36    Latest Reference Range & Units 10/27/21 08:58  WBC 4.0 - 10.5 K/uL 4.5  RBC 3.87 - 5.11 Mil/uL 4.51  Hemoglobin 12.0 - 15.0 g/dL 86.4  HCT 63.9 - 53.9 % 40.1  MCV 78.0 - 100.0 fl 89.0  MCHC 30.0 - 36.0 g/dL 66.2  RDW 88.4 - 84.4 % 13.3  Platelets 150.0 - 400.0 K/uL 211.0       FNA left nodule 12/12/2020 Follicular lesion of undetermined significance ( Bethesda III)   FNA left inferior 2.9 cm nodule 04/23/2022   Clinical History: Nodule #  7: Left; Inferior, Maximum size: 2.9 cm; Other  2 dimensions: 1.7 cm x 1.5 cm, mixed cystic and solid, isoechoic,  macrocalcifications, TI-RADS total points: 3.  Specimen Submitted:  A. THYROID , LT INFERIOR, FINE NEEDLE ASPIRATION:    FINAL MICROSCOPIC DIAGNOSIS:  - Consistent with benign follicular nodule (Bethesda category II)   FNA right superior 2.9 cm nodule 04/23/2022    Clinical History: Nodule #1: Right; Superior, Maximum size: 2.9 cm,  Other 2 dimensions: 1.3 cm x 1.2 cm, solid/almost completely solid,  isoechoic, macrocalcifications, TI-RADS total points: 4.  Specimen Submitted:  A. THYROID , RT SUPERIOR, FINE NEEDLE ASPIRATION:    FINAL MICROSCOPIC DIAGNOSIS:  - Consistent with benign follicular nodule (Bethesda category II)   FNA Right inferior Nodule  1.8 cm 05/22/2023   Clinical History: Nodule #4: Increasing size of hypoechoic solid nodule  in the right lower gland with a central dystrophic calcification. The  lesion measures 1.8 x 1.2 x 1.1 cm. Findings are consistent with TI-RADS  category 4.  Specimen Submitted:  A. THYROID , RT INFERIOR, FINE NEEDLE ASPIRATION    FINAL MICROSCOPIC DIAGNOSIS:  - Benign follicular nodule (Bethesda category II)      Thyroid  ULtrasound 03/14/2023    Estimated total number of nodules >/= 1 cm: 6-10   Number of spongiform nodules >/=  2 cm not described below (TR1): 0   Number of mixed cystic and solid nodules >/= 1.5 cm not described below (TR2): 0    _________________________________________________________   Nodule # 1: Previously biopsied lesion in the right upper gland has decreased in size now measuring 1.6 x 1.2 x 1.1 cm.   Nodule # 2: Subcentimeter nodule in the right upper gland. No further follow-up.   Nodule # 3: 1 cm isoechoic solid nodule in the right upper gland. Given size (<1.4 cm) and appearance, this nodule does NOT meet TI-RADS criteria for biopsy or dedicated follow-up.   Nodule # 4: Increasing size of hypoechoic solid nodule in the right lower gland with a central dystrophic calcification. The lesion measures 1.8 x 1.2 x 1.1 cm. Findings are consistent with TI-RADS category 4. **Given size (>/= 1.5 cm) and appearance, fine needle aspiration of this moderately suspicious nodule should be considered based on TI-RADS criteria.   Nodule # 5: Dystrophic calcification in the right inferior gland measures up to 1.3 cm.   Nodule # 6: Solid isoechoic nodule in the left upper gland measures 1.4 x 1.4 x 1.1 cm. This is decreased in size compared to prior. This lesion no longer meets criteria for imaging surveillance. Given size (<1.4 cm) and appearance, this nodule does NOT meet TI-RADS criteria for biopsy or dedicated follow-up.   Nodule # 7: Previously biopsied nodule in the left inferior gland is stable at 2.4 x 1.5 x 1.6 cm.   Nodule # 8: Solid isoechoic nodule at the left inferior gland is similar at 1.6 x 1.4 x 1.4 cm. Findings remain consistent with TI-RADS category 3. *Given size (>/= 1.5 - 2.4 cm) and appearance, a follow-up ultrasound in 1 year should be considered based on TI-RADS criteria.   IMPRESSION: 1. Interval enlargement of nodule # 4 in the right inferior gland. This 1.8 cm TI-RADS category 4 nodule now meets criteria to consider fine-needle aspiration biopsy. Biopsy is recommended. 2. Previously biopsied nodules # 1 in the right upper gland and # 7 in the left lower gland demonstrate no  significant interval change. Recommend correlation with prior biopsy results. 3. Stable appearance of nodule #  8 in the left lower gland. This lesion continues to meet criteria for imaging surveillance. This exam confirms 1 year of stability. Recommend follow-up ultrasound in 1 year. 4. Other thyroid  nodules have decreased in size and no longer meet criteria for surveillance.  Thyroid  uptake and scan 11/11/2020 FINDINGS:   Thyroid : Focal area of photopenia noted at the mid pole of the left thyroid  lobe corresponding to a hypodense thyroid  nodule. No discrete cold or hot nodule identified in the right thyroid  lobe.   Background activity: Normal.   Uptake at 24 hours is 24.6%, with upper limits of normal 30%.       ASSESSMENT/PLAN/RECOMMENDATIONS:   Hyperthyroidism :   - D/D of Graves' disease vs autonomous thyroid  nodule , given FH of thyroid  disease she is at high risk for Graves' disease.  - Patient opted to continue with methimazole  therapy rather than proceed with RAI or surgical intervention - TFTs remain normal, no change  Medications : Continue methimazole  5 mg, half a tablet Monday through Friday, none on Saturday and Sundays    2. MNG:  - Thyroid  uptake and scan showed a  COLD left thyroid  nodule with 24% uptake at 24 hrs.  - She is S/P FNA of the left dominant nodule with follicular lesion of undetermined significance (Bethesda Category III), per chart through care everywhere, thyroseq was benign  -She is s/p benign FNA of the right and the left thyroid  nodules 03/2022 - She is s/p benign FNA of the right inferior 1.8 cm nodule due to enlarging size and central dystrophic calcifications 04/2023     F/U in 6 months    Signed electronically by: Stefano Redgie Butts, MD  [facility] Endocrinology  [facility] [address] [place] [place], [place] [address] Phone: [phone] FAX: [phone]   CC: Oris Camie BRAVO, NP [address] [place] [address] Phone: [phone] Fax: [phone]   Return to Endocrinology clinic as below: Future Appointments  Date Time Provider Department Center  12/12/2023  1:50 PM Caudle, Thersia Bitters, FNP [facility]

## 2023-09-30 ENCOUNTER — Ambulatory Visit: Payer: Self-pay | Admitting: Internal Medicine

## 2023-09-30 MED ORDER — METHIMAZOLE 5 MG PO TABS: 2.5000 mg | ORAL_TABLET | ORAL | 3 refills | Status: DC

## 2023-10-31 ENCOUNTER — Ambulatory Visit (HOSPITAL_BASED_OUTPATIENT_CLINIC_OR_DEPARTMENT_OTHER): Admitting: Family Medicine

## 2023-11-07 DIAGNOSIS — Z13820 Encounter for screening for osteoporosis: Secondary | ICD-10-CM | POA: Diagnosis not present

## 2023-11-12 DIAGNOSIS — D485 Neoplasm of uncertain behavior of skin: Secondary | ICD-10-CM | POA: Diagnosis not present

## 2023-11-12 DIAGNOSIS — D1722 Benign lipomatous neoplasm of skin and subcutaneous tissue of left arm: Secondary | ICD-10-CM | POA: Diagnosis not present

## 2023-11-12 DIAGNOSIS — C44612 Basal cell carcinoma of skin of right upper limb, including shoulder: Secondary | ICD-10-CM | POA: Diagnosis not present

## 2023-11-12 DIAGNOSIS — Z85828 Personal history of other malignant neoplasm of skin: Secondary | ICD-10-CM | POA: Diagnosis not present

## 2023-12-05 DIAGNOSIS — L821 Other seborrheic keratosis: Secondary | ICD-10-CM | POA: Diagnosis not present

## 2023-12-05 DIAGNOSIS — C44612 Basal cell carcinoma of skin of right upper limb, including shoulder: Secondary | ICD-10-CM | POA: Diagnosis not present

## 2023-12-05 DIAGNOSIS — L814 Other melanin hyperpigmentation: Secondary | ICD-10-CM | POA: Diagnosis not present

## 2023-12-05 DIAGNOSIS — D225 Melanocytic nevi of trunk: Secondary | ICD-10-CM | POA: Diagnosis not present

## 2023-12-12 ENCOUNTER — Encounter (HOSPITAL_BASED_OUTPATIENT_CLINIC_OR_DEPARTMENT_OTHER): Payer: Self-pay | Admitting: *Deleted

## 2023-12-12 ENCOUNTER — Ambulatory Visit (HOSPITAL_BASED_OUTPATIENT_CLINIC_OR_DEPARTMENT_OTHER): Admitting: Family Medicine

## 2023-12-12 VITALS — BP 132/88 | HR 88 | Ht 67.0 in | Wt 201.0 lb

## 2023-12-12 DIAGNOSIS — E0590 Thyrotoxicosis, unspecified without thyrotoxic crisis or storm: Secondary | ICD-10-CM | POA: Diagnosis not present

## 2023-12-12 DIAGNOSIS — E669 Obesity, unspecified: Secondary | ICD-10-CM

## 2023-12-12 DIAGNOSIS — Z7689 Persons encountering health services in other specified circumstances: Secondary | ICD-10-CM

## 2023-12-12 LAB — POCT GLYCOSYLATED HEMOGLOBIN (HGB A1C)
HbA1c POC (<> result, manual entry): 5.3 % (ref 4.0–5.6)
Hemoglobin A1C: 5.3 % (ref 4.0–5.6)

## 2023-12-12 NOTE — Progress Notes (Signed)
 Subjective:   Elizabeth Carpenter 01-17-1961 12/12/2023  Chief Complaint  Patient presents with   New Patient (Initial Visit)    Patient is here today to get established with provider. Former pt of Camie Doing. Mainly is needing a general checkup but does have concerns about her weight.    Discussed the use of AI scribe software for clinical note transcription with the patient, who gave verbal consent to proceed.  History of Present Illness Elizabeth Carpenter is a 63 year old female who presents with concerns about weight management.  She is experiencing gradual weight gain, which she attributes to menopause, and finds it difficult to lose weight. She maintains a gym membership and exercises with a partner, focusing on walking. She has tried various diets, including Randall Banks and Atkins, with only temporary success. She recently started using a journal to visually track her diet and is considering programs like Noom or Weight Watchers.  Her current medications include calcium, vitamin D , estrogen cream, an estrogen patch, Flonase , Claritin, methimazole , a multivitamin, progesterone , rhubarb, and vitamin B12. She takes methimazole  five days a week and is on hormone replacement therapy, including progesterone  and an estrogen patch, initiated by an integrative doctor in [place].  She has a family history of blood clots, arthritis, diabetes, hearing loss, and varicose veins. Both her parents and her brother have experienced blood clots. She underwent a coagulopathy workup before starting hormone replacement therapy, which did not reveal any genetic predisposition to clotting disorders.  She is concerned about potential prediabetes, as she was previously on the border of prediabetes two years ago. She sees an endocrinologist, Dr. Elouise Seton, and her recent thyroid  function tests were normal for subclinical hyperthyroidism.    Wt Readings from Last 3 Encounters:  12/12/23 201 lb (91.2 kg)   09/27/23 194 lb (88 kg)  03/11/23 194 lb (88 kg)     The following portions of the patient's history were reviewed and updated as appropriate: past medical history, past surgical history, family history, social history, allergies, medications, and problem list.   Patient Active Problem List   Diagnosis Date Noted   Subclinical hyperthyroidism 12/12/2023   Chronic cough 07/31/2021   Extensor carpi ulnaris tendinitis 07/31/2021   Chronic fatigue 07/31/2021   Encounter to establish care 07/28/2020   Encounter for annual physical exam 07/28/2020   Family history of diabetes mellitus in first degree relative 07/28/2020   Family history of thyroid  disease 07/28/2020   Body mass index 29.0-29.9, adult 07/28/2020   Leiomyoma of uterus 02/27/2018   S/P laparoscopic assisted vaginal hysterectomy (LAVH) 02/27/2018   Past Medical History:  Diagnosis Date   Allergy Teenager- hay fever   2006 amoxicillin and morphine   Anemia    Fibroids    GERD (gastroesophageal reflux disease) 1995   History of blood transfusion    Patient had a transfusion at birth in1962   Seasonal allergies    SVD (spontaneous vaginal delivery)    x 2   Thyroid  disease 2-3 years ago   Hyoer thyroid    Past Surgical History:  Procedure Laterality Date   ABDOMINAL HYSTERECTOMY  2020   BREAST SURGERY  2006   reduction   COLONOSCOPY     DILATION AND CURETTAGE OF UTERUS     HYSTERECTOMY ABDOMINAL WITH SALPINGO-OOPHORECTOMY Bilateral 02/27/2018   Procedure: possible HYSTERECTOMY ABDOMINAL WITH SALPINGO-OOPHORECTOMY;  Surgeon: Marget Lenis, MD;  Location: [facility];  Service: Gynecology;  Laterality: Bilateral;   LAPAROSCOPIC VAGINAL HYSTERECTOMY WITH SALPINGO OOPHORECTOMY  Bilateral 02/27/2018   Procedure: LAPAROSCOPIC ASSISTED VAGINAL HYSTERECTOMY WITH SALPINGO OOPHORECTOMY;  Surgeon: Marget Lenis, MD;  Location: [facility];  Service: Gynecology;  Laterality: Bilateral;   UPPER GI ENDOSCOPY     WISDOM TOOTH EXTRACTION      Family History  Problem Relation Age of Onset   Thrombosis Mother    Arthritis Mother    Diabetes Mother    Hearing loss Mother    Varicose Veins Mother    Thrombosis Father    Thrombosis Brother    Outpatient Medications Prior to Visit  Medication Sig Dispense Refill   Calcium Carbonate 500 MG CHEW Chew by mouth.     Cholecalciferol 125 MCG (5000 UT) capsule Take 5,000 Units by mouth daily.     conjugated estrogens (PREMARIN) vaginal cream      estradiol  (VIVELLE -DOT) 0.1 MG/24HR patch Apply 1 patch twice a week by transdermal route.     fluticasone  (FLONASE ) 50 MCG/ACT nasal spray Place 1 spray into both nostrils daily. 16 g 11   loratadine (CLARITIN) 10 MG tablet Take 10 mg by mouth daily as needed for allergies.     methimazole  (TAPAZOLE ) 5 MG tablet Take 0.5 tablets (2.5 mg total) by mouth as directed. Half a tablet Monday through Friday only 33 tablet 3   Multiple Vitamin (MULTIVITAMIN WITH MINERALS) TABS tablet Take 1 tablet by mouth daily.     PROGESTERONE  PO Take by mouth.     Rhubarb (ESTROVEN COMPLETE) 4 MG TABS Take by mouth.     vitamin B-12 (CYANOCOBALAMIN ) 1000 MCG tablet Take 1,000 mcg by mouth daily.     D 1000 25 MCG (1000 UT) capsule Take 1,000 Units by mouth daily.     No facility-administered medications prior to visit.   Allergies  Allergen Reactions   Morphine And Codeine Nausea And Vomiting   Amoxicillin Rash     ROS: A complete ROS was performed with pertinent positives/negatives noted in the HPI. The remainder of the ROS are negative.    Objective:   Today's Vitals   12/12/23 1348 12/12/23 1423  BP: (!) 136/99 132/88  Pulse: 88   SpO2: 98%   Weight: 201 lb (91.2 kg)   Height: 5' 7 (1.702 m)     Physical Exam MEASUREMENTS: Weight- 90.  GENERAL: Well-appearing, in NAD. Well nourished.  SKIN: Pink, warm and dry.  Head: Normocephalic. NECK: Trachea midline. Full ROM w/o pain or tenderness.  RESPIRATORY: Chest wall symmetrical.  Respirations even and non-labored. Breath sounds clear to auscultation bilaterally.  CARDIAC: S1, S2 present, regular rate and rhythm without murmur or gallops. Peripheral pulses 2+ bilaterally.  MSK: Muscle tone and strength appropriate for age. NEUROLOGIC: No motor or sensory deficits. Steady, even gait. C2-C12 intact.  PSYCH/MENTAL STATUS: Alert, oriented x 3. Cooperative, appropriate mood and affect.       Assessment & Plan:  1. Encounter to establish care with new doctor (Primary) Discussed role of ACP and reviewed patient's medical history, family history and surgical history  2. Subclinical hyperthyroidism Stable.  Currently followed by endocrinology.  Will continue with scheduled visits.  Prior thyroid  labs reviewed by PCP.  3. Obesity (BMI 30-39.9) Discussed caloric deficit, regular exercise, and nutrition counseling.  A1c is negative for prediabetes.  Patient will try conservative efforts first and if desiring medication management will reach out to PCP. - POCT glycosylated hemoglobin (Hb A1C)    No orders of the defined types were placed in this encounter.  Lab Orders  POCT glycosylated hemoglobin (Hb A1C)     No images are attached to the encounter or orders placed in the encounter.  Return in about 3 months (around 03/13/2024) for ANNUAL PHYSICAL (fasting labs at visit) .    Patient to reach out to office if new, worrisome, or unresolved symptoms arise or if no improvement in patient's condition. Patient verbalized understanding and is agreeable to treatment plan. All questions answered to patient's satisfaction.    Thersia Schuyler Stark, [place]

## 2023-12-12 NOTE — Patient Instructions (Addendum)
 Caloric Deficit: Aim for 1900 calories daily  Protein intake: 75-90 grams/day   Apps:  LoseIt! App- calorie tracker Carb Manager    VISIT SUMMARY: Today, we discussed your concerns about weight management, particularly the gradual weight gain you have been experiencing, which you believe is related to menopause. We reviewed your current exercise routine, dietary habits, and medications. We also addressed your concerns about potential prediabetes and reviewed your thyroid  function.  YOUR PLAN: -OVERWEIGHT: Your weight gain is likely linked to menopausal changes. We will order lab work to check for prediabetes or insulin  resistance. I recommend you aim for a protein intake of 1 gram per kilogram of body weight, which is about 90 grams per day, spread across your meals. Good protein sources include cottage cheese, protein powders, lean meats, cheese, yogurt, and protein shakes. Consider using tracking tools like Noom or Weight Watchers to help maintain a caloric deficit.  -MENOPAUSAL STATE: You are currently in menopause and are on hormone replacement therapy with progesterone  and an estrogen patch, managed by your OBGYN and integrative doctor. This therapy helps manage menopausal symptoms.  -THYROTOXICOSIS: Thyrotoxicosis is a condition where the thyroid  gland produces too much thyroid  hormone. Your condition is well-managed by your endocrinologist with methimazole , which you take five days a week. Your recent thyroid  function tests show good control.  -GENERAL HEALTH MAINTENANCE: Continue with regular health maintenance, including mammograms and routine labs with your OBGYN. Keep taking your supplements: calcium, vitamin D , multivitamins, rhubarb, and vitamin B12.  INSTRUCTIONS: Please complete the lab work for prediabetes or insulin  resistance as discussed. Continue following up with your OBGYN for hormone replacement therapy and regular health maintenance. Maintain your current exercise  routine and consider using Noom or Weight Watchers for dietary tracking. Follow up with your endocrinologist as needed for thyroid  management.Caloric Deficit: Aim for 1900 calories daily  Protein intake: 75-90 grams/day   Apps:  LoseIt! App- calorie tracker Carb Manager    VISIT SUMMARY: Today, we discussed your concerns about weight management, particularly the gradual weight gain you have been experiencing, which you believe is related to menopause. We reviewed your current exercise routine, dietary habits, and medications. We also addressed your concerns about potential prediabetes and reviewed your thyroid  function.  YOUR PLAN: -OVERWEIGHT: recommend you aim for a protein intake of 1 gram per kilogram of body weight, which is about 90 grams per day, spread across your meals. Good protein sources include cottage cheese, protein powders, lean meats, cheese, yogurt, and protein shakes. Consider using tracking tools like Noom or Weight Watchers to help maintain a caloric deficit.  INSTRUCTIONS:  Continue following up with your OBGYN for hormone replacement therapy and regular health maintenance. Maintain your current exercise routine and consider using Noom or Weight Watchers for dietary tracking. Follow up with your endocrinologist as needed for thyroid  management.

## 2023-12-20 ENCOUNTER — Encounter (HOSPITAL_BASED_OUTPATIENT_CLINIC_OR_DEPARTMENT_OTHER): Payer: Self-pay | Admitting: Family Medicine

## 2024-01-02 DIAGNOSIS — M9902 Segmental and somatic dysfunction of thoracic region: Secondary | ICD-10-CM | POA: Diagnosis not present

## 2024-01-02 DIAGNOSIS — M9906 Segmental and somatic dysfunction of lower extremity: Secondary | ICD-10-CM | POA: Diagnosis not present

## 2024-01-02 DIAGNOSIS — M9901 Segmental and somatic dysfunction of cervical region: Secondary | ICD-10-CM | POA: Diagnosis not present

## 2024-01-02 DIAGNOSIS — M9903 Segmental and somatic dysfunction of lumbar region: Secondary | ICD-10-CM | POA: Diagnosis not present

## 2024-01-03 ENCOUNTER — Ambulatory Visit (HOSPITAL_BASED_OUTPATIENT_CLINIC_OR_DEPARTMENT_OTHER): Payer: Self-pay | Admitting: Family Medicine

## 2024-03-16 ENCOUNTER — Other Ambulatory Visit: Payer: Self-pay | Admitting: Internal Medicine

## 2024-03-16 DIAGNOSIS — E0590 Thyrotoxicosis, unspecified without thyrotoxic crisis or storm: Secondary | ICD-10-CM

## 2024-03-30 ENCOUNTER — Ambulatory Visit: Admitting: Internal Medicine

## 2024-03-31 ENCOUNTER — Encounter (HOSPITAL_BASED_OUTPATIENT_CLINIC_OR_DEPARTMENT_OTHER): Admitting: Family Medicine

## 2024-04-02 ENCOUNTER — Telehealth: Payer: Self-pay | Admitting: Internal Medicine

## 2024-04-02 ENCOUNTER — Telehealth: Admitting: Internal Medicine

## 2024-04-02 ENCOUNTER — Encounter: Payer: Self-pay | Admitting: Internal Medicine

## 2024-04-02 VITALS — Ht 67.0 in | Wt 195.0 lb

## 2024-04-02 DIAGNOSIS — E042 Nontoxic multinodular goiter: Secondary | ICD-10-CM

## 2024-04-02 DIAGNOSIS — E0590 Thyrotoxicosis, unspecified without thyrotoxic crisis or storm: Secondary | ICD-10-CM

## 2024-04-02 MED ORDER — METHIMAZOLE 5 MG PO TABS: 2.5000 mg | ORAL_TABLET | ORAL | 3 refills | Status: AC

## 2024-04-02 NOTE — Telephone Encounter (Signed)
 Can you please schedule patient for follow-up with me in 1 year for thyroid ?   Thanks

## 2024-04-02 NOTE — Progress Notes (Signed)
 Virtual Visit via Video Note  I connected with Elizabeth Carpenter  on 04/02/24 at  8:50 AM EST by a video enabled telemedicine application and verified that I am speaking with the correct person using two identifiers.   I discussed the limitations of evaluation and management by telemedicine and the availability of in person appointments. The patient expressed understanding and agreed to proceed.   -Location of the patient : Home -Location of the provider : Office -The names of all persons participating in the telemedicine service : Pt and myself     Name: Elizabeth Carpenter  MRN/ DOB: [id number], October 18, 1960    Age/ Sex: 64 y.o., female    PCP: Knute Thersia Bitters, FNP   Reason for Endocrinology Evaluation: Hyperthyroidism     Date of Initial Endocrinology Evaluation: 10/27/2021    HPI: Elizabeth Carpenter is a 64 y.o. female with unremarkable  past medical history . The patient presented for initial endocrinology clinic visit on 10/27/2021 for consultative assistance with her Hyperthyroidism.   Pt has been diagnosed with sub-clinical hyperthyroidism since 07/2020 with a TSH of 0.073 uIU/mL with normal T4, these results were confirmed in 07/2021    She was seen by [facility]  on 09/19/2020, Anti-TPO were normal at 3 IU/mL, TRAb was normal at <1.1 IU/L ( reference 0.00-1.75).  Uptake and scan on 11/11/2020 showed a focal area of photopenia noted at the left mid pole of the lobe,no other discrete hot or cold nodule identified. 24-Hr uptake was normal at 24.6%   A thyroid  ultrasound on 12/12/2020 showed left mid to lower pole spongiform nodule with marked peripheral calcifications but no other nodules.   She is S/P FNA of the left nodule 12/12/2020 with a cytology report showing follicular lesion [facility] category III) . Thyroseq was negative per chart note   No XRT  Started methimazole  10/2021 with a TSH of 0.18 uIU/mL and fatigue  Mother and brother with thyroid  disease   She  is s/p benign FNA of the right superior 2.9 cm and left inferior 2.9 cm nodules 03/2022  She is s/p benign FNA of enlarging right inferior 1.8 cm nodule 04/2023   SUBJECTIVE:    Today (04/02/24): Elizabeth Carpenter is here for follow-up on multinodular goiter and hyperthyroidism.  She had ran out of estrogen patch, and has been feeling a bit tired No local neck symptoms No dysphagia No tremors No changes in bowel movements, but she does have an upcoming colonoscopy, they have to use a pediatric scope due to small size of colon  Methimazole  5 mg, half a tablet Monday through Friday , none on Saturdays or Sundays    HISTORY:  Past Medical History:  Past Medical History:  Diagnosis Date   Allergy Teenager- hay fever   2006 amoxicillin and morphine   Anemia    Fibroids    GERD (gastroesophageal reflux disease) 1995   History of blood transfusion    Patient had a transfusion at birth in1962   Seasonal allergies    SVD (spontaneous vaginal delivery)    x 2   Thyroid  disease 2-3 years ago   Hyoer thyroid    Past Surgical History:  Past Surgical History:  Procedure Laterality Date   ABDOMINAL HYSTERECTOMY  2020   BREAST SURGERY  2006   reduction   COLONOSCOPY     DILATION AND CURETTAGE OF UTERUS     HYSTERECTOMY ABDOMINAL WITH SALPINGO-OOPHORECTOMY Bilateral 02/27/2018   Procedure: possible HYSTERECTOMY ABDOMINAL WITH SALPINGO-OOPHORECTOMY;  Surgeon: Marget Lenis,  MD;  Location: [facility];  Service: Gynecology;  Laterality: Bilateral;   LAPAROSCOPIC VAGINAL HYSTERECTOMY WITH SALPINGO OOPHORECTOMY Bilateral 02/27/2018   Procedure: LAPAROSCOPIC ASSISTED VAGINAL HYSTERECTOMY WITH SALPINGO OOPHORECTOMY;  Surgeon: Marget Lenis, MD;  Location: [facility];  Service: Gynecology;  Laterality: Bilateral;   UPPER GI ENDOSCOPY     WISDOM TOOTH EXTRACTION      Social History:  reports that she has never smoked. She has never used smokeless tobacco. She reports current alcohol use of about 3.0 - 5.0  standard drinks of alcohol per week. She reports that she does not use drugs. Family History: family history includes Arthritis in her mother; Diabetes in her mother; Hearing loss in her mother; Thrombosis in her brother, father, and mother; Varicose Veins in her mother.   HOME MEDICATIONS: Allergies as of 04/02/2024       Reactions   Morphine And Codeine Nausea And Vomiting   Amoxicillin Rash        Medication List        Accurate as of April 02, 2024  8:25 AM. If you have any questions, ask your nurse or doctor.          Calcium Carbonate 500 MG Chew Chew by mouth.   Cholecalciferol 125 MCG (5000 UT) capsule Take 5,000 Units by mouth daily.   cyanocobalamin  1000 MCG tablet Commonly known as: VITAMIN B12 Take 1,000 mcg by mouth daily.   estradiol  0.1 MG/24HR patch Commonly known as: VIVELLE -DOT Apply 1 patch twice a week by transdermal route.   Estroven Complete 4 MG Tabs Generic drug: Rhubarb Take by mouth.   fluticasone  50 MCG/ACT nasal spray Commonly known as: FLONASE  Place 1 spray into both nostrils daily.   loratadine 10 MG tablet Commonly known as: CLARITIN Take 10 mg by mouth daily as needed for allergies.   methimazole  5 MG tablet Commonly known as: TAPAZOLE  TAKE 1/2 TABLET MONDAY THROUGH FRIDAY ONLY   multivitamin with minerals Tabs tablet Take 1 tablet by mouth daily.   Premarin vaginal cream Generic drug: conjugated estrogens   PROGESTERONE  PO Take by mouth.          REVIEW OF SYSTEMS: A comprehensive ROS was conducted with the patient and is negative except as per HPI   OBJECTIVE:  VS: LMP  (LMP Unknown)    Wt Readings from Last 3 Encounters:  12/12/23 201 lb (91.2 kg)  09/27/23 194 lb (88 kg)  03/11/23 194 lb (88 kg)     EXAM: General: Pt appears well and is in NAD  Mental Status: Judgment, insight: Intact Orientation: Oriented to time, place, and person Mood and affect: No depression, anxiety, or agitation      DATA REVIEWED:   Latest Reference Range & Units 09/27/23 09:57  TSH 0.40 - 4.50 mIU/L 1.09  T4,Free(Direct) 0.8 - 1.8 ng/dL 1.0    Thyroid  Ultrasound 03/14/2023  Estimated total number of nodules >/= 1 cm: 6-10   Number of spongiform nodules >/=  2 cm not described below (TR1): 0   Number of mixed cystic and solid nodules >/= 1.5 cm not described below (TR2): 0   _________________________________________________________   Nodule # 1: Previously biopsied lesion in the right upper gland has decreased in size now measuring 1.6 x 1.2 x 1.1 cm.   Nodule # 2: Subcentimeter nodule in the right upper gland. No further follow-up.   Nodule # 3: 1 cm isoechoic solid nodule in the right upper gland. Given size (<1.4 cm) and appearance, this nodule  does NOT meet TI-RADS criteria for biopsy or dedicated follow-up.   Nodule # 4: Increasing size of hypoechoic solid nodule in the right lower gland with a central dystrophic calcification. The lesion measures 1.8 x 1.2 x 1.1 cm. Findings are consistent with TI-RADS category 4. **Given size (>/= 1.5 cm) and appearance, fine needle aspiration of this moderately suspicious nodule should be considered based on TI-RADS criteria.   Nodule # 5: Dystrophic calcification in the right inferior gland measures up to 1.3 cm.   Nodule # 6: Solid isoechoic nodule in the left upper gland measures 1.4 x 1.4 x 1.1 cm. This is decreased in size compared to prior. This lesion no longer meets criteria for imaging surveillance. Given size (<1.4 cm) and appearance, this nodule does NOT meet TI-RADS criteria for biopsy or dedicated follow-up.   Nodule # 7: Previously biopsied nodule in the left inferior gland is stable at 2.4 x 1.5 x 1.6 cm.   Nodule # 8: Solid isoechoic nodule at the left inferior gland is similar at 1.6 x 1.4 x 1.4 cm. Findings remain consistent with TI-RADS category 3. *Given size (>/= 1.5 - 2.4 cm) and appearance, a follow-up  ultrasound in 1 year should be considered based on TI-RADS criteria.   IMPRESSION: 1. Interval enlargement of nodule # 4 in the right inferior gland. This 1.8 cm TI-RADS category 4 nodule now meets criteria to consider fine-needle aspiration biopsy. Biopsy is recommended. 2. Previously biopsied nodules # 1 in the right upper gland and # 7 in the left lower gland demonstrate no significant interval change. Recommend correlation with prior biopsy results. 3. Stable appearance of nodule # 8 in the left lower gland. This lesion continues to meet criteria for imaging surveillance. This exam confirms 1 year of stability. Recommend follow-up ultrasound in 1 year. 4. Other thyroid  nodules have decreased in size and no longer meet criteria for surveillance.    FNA left nodule 12/12/2020 Follicular lesion of undetermined significance ( [facility]hesda III)   FNA left inferior 2.9 cm nodule 04/23/2022   Clinical History: Nodule #7: Left; Inferior, Maximum size: 2.9 cm; Other  2 dimensions: 1.7 cm x 1.5 cm, mixed cystic and solid, isoechoic,  macrocalcifications, TI-RADS total points: 3.  Specimen Submitted:  A. THYROID , LT INFERIOR, FINE NEEDLE ASPIRATION:    FINAL MICROSCOPIC DIAGNOSIS:  - Consistent with benign follicular nodule (Bethesda category II)   FNA right superior 2.9 cm nodule 04/23/2022    Clinical History: Nodule #1: Right; Superior, Maximum size: 2.9 cm,  Other 2 dimensions: 1.3 cm x 1.2 cm, solid/almost completely solid,  isoechoic, macrocalcifications, TI-RADS total points: 4.  Specimen Submitted:  A. THYROID , RT SUPERIOR, FINE NEEDLE ASPIRATION:    FINAL MICROSCOPIC DIAGNOSIS:  - Consistent with benign follicular nodule (Bethesda category II)   FNA Right inferior Nodule  1.8 cm 05/22/2023   Clinical History: Nodule #4: Increasing size of hypoechoic solid nodule  in the right lower gland with a central dystrophic calcification. The  lesion measures 1.8 x 1.2 x 1.1 cm.  Findings are consistent with TI-RADS  category 4.  Specimen Submitted:  A. THYROID , RT INFERIOR, FINE NEEDLE ASPIRATION    FINAL MICROSCOPIC DIAGNOSIS:  - Benign follicular nodule (Bethesda category II)     Thyroid  uptake and scan 11/11/2020 FINDINGS:   Thyroid : Focal area of photopenia noted at the mid pole of the left thyroid  lobe corresponding to a hypodense thyroid  nodule. No discrete cold or hot nodule identified in the right thyroid  lobe.  Background activity: Normal.   Uptake at 24 hours is 24.6%, with upper limits of normal 30%.       ASSESSMENT/PLAN/RECOMMENDATIONS:   Hyperthyroidism :   - D/D of Graves' disease vs autonomous thyroid  nodule , given FH of thyroid  disease she is at high risk for Graves' disease.  - Patient opted to continue with methimazole  therapy rather than proceed with RAI or surgical intervention - Patient has been noted with fatigue, but this is probably due to running out of estrogen patches, historically her TFTs have been stable on current dose - Patient will have a physical through PCPs office in March, and will have labs done at the time  Medications : Continue methimazole  5 mg, half a tablet Monday through Friday, none on Saturday and Sundays    2. MNG:  -No local neck symptoms - Thyroid  uptake and scan showed a  COLD left thyroid  nodule with 24% uptake at 24 hrs.  - She is S/P FNA of the left dominant nodule with follicular lesion of undetermined significance (Bethesda Category III), per chart through care everywhere, thyroseq was benign  -She is s/p benign FNA of the right and the left thyroid  nodules 03/2022 - She is s/p benign FNA of the right inferior 1.8 cm nodule due to enlarging size and central dystrophic calcifications 04/2023 - Will proceed with repeat thyroid  ultrasound    F/U in 1 year   Signed electronically by: Stefano Redgie Butts, MD  [facility]  [facility] [address]  [address] [place], [place] [address] Phone: [phone] FAX: [phone]   CC: Knute Thersia Bitters, FNP [address] [place] [address] Phone: [phone] Fax: [phone]   Return to Endocrinology clinic as below: Future Appointments  Date Time Provider Department Center  04/02/2024  8:50 AM Sheela Mcculley, Donell Redgie, MD [facility] None  05/26/2024  9:30 AM Knute Thersia Bitters, FNP [facility] [phone] [place]

## 2024-05-26 ENCOUNTER — Encounter (HOSPITAL_BASED_OUTPATIENT_CLINIC_OR_DEPARTMENT_OTHER): Admitting: Family Medicine

## 2025-04-02 ENCOUNTER — Ambulatory Visit: Admitting: Internal Medicine
